# Patient Record
Sex: Female | Born: 1990 | Race: Black or African American | Hispanic: No | Marital: Single | Smoking: Current every day smoker
Health system: Southern US, Community
[De-identification: ages and names within clinical notes are randomized; demographics above are authoritative.]

## PROBLEM LIST (undated history)

## (undated) DIAGNOSIS — J45909 Unspecified asthma, uncomplicated: Secondary | ICD-10-CM

## (undated) HISTORY — PX: NIPPLE SPARING MASTECTOMY/SENTINAL LYMPH NODE BIOPSY/RECONSTRUCTION/PLACEMENT OF TISSUE EXPANDER: SHX6484

## (undated) HISTORY — PX: NO PAST SURGERIES: SHX2092

---

## 2018-12-20 ENCOUNTER — Other Ambulatory Visit: Payer: Self-pay

## 2018-12-20 ENCOUNTER — Encounter (HOSPITAL_COMMUNITY): Payer: Self-pay | Admitting: Obstetrics and Gynecology

## 2018-12-20 ENCOUNTER — Emergency Department (HOSPITAL_COMMUNITY)
Admission: EM | Admit: 2018-12-20 | Discharge: 2018-12-20 | Disposition: A | Discharge status: Home / Self Care | Payer: Self-pay | Attending: Emergency Medicine | Admitting: Emergency Medicine

## 2018-12-20 DIAGNOSIS — R1031 Right lower quadrant pain: Secondary | ICD-10-CM | POA: Insufficient documentation

## 2018-12-20 DIAGNOSIS — Z5321 Procedure and treatment not carried out due to patient leaving prior to being seen by health care provider: Secondary | ICD-10-CM | POA: Insufficient documentation

## 2018-12-20 HISTORY — DX: Unspecified asthma, uncomplicated: J45.909

## 2018-12-20 LAB — CBC
HCT: 36.1 % (ref 36.0–46.0)
Hemoglobin: 10.4 g/dL — ABNORMAL LOW (ref 12.0–15.0)
MCH: 21.9 pg — AB (ref 26.0–34.0)
MCHC: 28.8 g/dL — ABNORMAL LOW (ref 30.0–36.0)
MCV: 76.2 fL — ABNORMAL LOW (ref 80.0–100.0)
Platelets: 388 10*3/uL (ref 150–400)
RBC: 4.74 MIL/uL (ref 3.87–5.11)
RDW: 19.3 % — ABNORMAL HIGH (ref 11.5–15.5)
WBC: 8.3 10*3/uL (ref 4.0–10.5)
nRBC: 0 % (ref 0.0–0.2)

## 2018-12-20 LAB — COMPREHENSIVE METABOLIC PANEL
ALT: 10 U/L (ref 0–44)
ANION GAP: 7 (ref 5–15)
AST: 18 U/L (ref 15–41)
Albumin: 4.1 g/dL (ref 3.5–5.0)
Alkaline Phosphatase: 63 U/L (ref 38–126)
BUN: 11 mg/dL (ref 6–20)
CO2: 25 mmol/L (ref 22–32)
Calcium: 8.8 mg/dL — ABNORMAL LOW (ref 8.9–10.3)
Chloride: 109 mmol/L (ref 98–111)
Creatinine, Ser: 1 mg/dL (ref 0.44–1.00)
GFR calc Af Amer: >60 mL/min (ref >60)
GFR calc non Af Amer: >60 mL/min (ref >60)
Glucose, Bld: 85 mg/dL (ref 70–99)
POTASSIUM: 4.5 mmol/L (ref 3.5–5.1)
Sodium: 141 mmol/L (ref 135–145)
TOTAL PROTEIN: 7.2 g/dL (ref 6.5–8.1)
Total Bilirubin: 0.5 mg/dL (ref 0.3–1.2)

## 2018-12-20 LAB — I-STAT BETA HCG BLOOD, ED (MC, WL, AP ONLY)

## 2018-12-20 LAB — LIPASE, BLOOD: Lipase: 34 U/L (ref 11–51)

## 2018-12-20 NOTE — ED Triage Notes (Signed)
Pt reports pain in the center and RLQ. Pt reports she has had some nausea and emesis but denies diarrhea. Pt reports that her appetite has be pretty normal today.

## 2019-01-11 ENCOUNTER — Emergency Department (HOSPITAL_COMMUNITY): Payer: Self-pay

## 2019-01-11 ENCOUNTER — Emergency Department (HOSPITAL_COMMUNITY)
Admission: EM | Admit: 2019-01-11 | Discharge: 2019-01-11 | Disposition: A | Discharge status: Home / Self Care | Payer: Self-pay | Attending: Emergency Medicine | Admitting: Emergency Medicine

## 2019-01-11 ENCOUNTER — Encounter (HOSPITAL_COMMUNITY): Payer: Self-pay | Admitting: Emergency Medicine

## 2019-01-11 DIAGNOSIS — X58XXXA Exposure to other specified factors, initial encounter: Secondary | ICD-10-CM | POA: Insufficient documentation

## 2019-01-11 DIAGNOSIS — Y999 Unspecified external cause status: Secondary | ICD-10-CM | POA: Insufficient documentation

## 2019-01-11 DIAGNOSIS — S93402A Sprain of unspecified ligament of left ankle, initial encounter: Secondary | ICD-10-CM | POA: Insufficient documentation

## 2019-01-11 DIAGNOSIS — J45909 Unspecified asthma, uncomplicated: Secondary | ICD-10-CM | POA: Insufficient documentation

## 2019-01-11 DIAGNOSIS — Y929 Unspecified place or not applicable: Secondary | ICD-10-CM | POA: Insufficient documentation

## 2019-01-11 DIAGNOSIS — F17210 Nicotine dependence, cigarettes, uncomplicated: Secondary | ICD-10-CM | POA: Insufficient documentation

## 2019-01-11 DIAGNOSIS — Y939 Activity, unspecified: Secondary | ICD-10-CM | POA: Insufficient documentation

## 2019-01-11 MED ORDER — TRAMADOL HCL 50 MG PO TABS
50.0000 mg | ORAL_TABLET | Freq: Once | ORAL | Status: AC
  Administered 2019-01-11: 50 mg via ORAL
  Filled 2019-01-11: qty 1

## 2019-01-11 MED ORDER — NAPROXEN 250 MG PO TABS
500.0000 mg | ORAL_TABLET | Freq: Once | ORAL | Status: AC
  Administered 2019-01-11: 500 mg via ORAL
  Filled 2019-01-11: qty 2

## 2019-01-11 MED ORDER — NAPROXEN 500 MG PO TABS: 500.0000 mg | ORAL_TABLET | Freq: Two times a day (BID) | ORAL | 0 refills | Status: DC

## 2019-01-11 NOTE — ED Triage Notes (Signed)
Pt reports drinking tonight and that she twisted ankle, swelling noted to left ankle.

## 2019-01-11 NOTE — ED Notes (Signed)
PA at bedside.

## 2019-01-11 NOTE — Discharge Instructions (Signed)
Wear a brace at all times for ankle stability, though you may remove this to shower or bathe.  Take naproxen as prescribed to help improve pain and inflammation.  Apply ice to your ankle 3-4 times per day for 15 to 20 minutes each time to limit swelling.  Use crutches from putting weight on your left foot and ankle to help promote healing.  Follow-up with a primary care doctor to ensure that symptoms resolve.

## 2019-01-11 NOTE — ED Provider Notes (Signed)
[facility] EMERGENCY DEPARTMENT Provider Note   CSN: [id number] Arrival date & time: 01/11/19  2111     History   Chief Complaint Chief Complaint  Patient presents with  . Ankle Pain    HPI Lauren French is a 28 y.o. female.  The history is provided by the patient. No language interpreter was used.  Ankle Pain  Location:  Ankle Time since incident:  2 hours Injury: yes   Mechanism of injury comment:  Rolled ankle while walking down concrete steps Ankle location:  L ankle Pain details:    Quality:  Aching   Radiates to:  Does not radiate   Severity:  Moderate   Onset quality:  Sudden   Duration:  2 hours   Timing:  Constant   Progression:  Unchanged Chronicity:  New Dislocation: no   Prior injury to area:  Unable to specify Relieved by:  Nothing Worsened by:  Bearing weight Ineffective treatments:  Acetaminophen Associated symptoms: swelling   Associated symptoms: no muscle weakness, no numbness and no tingling   Risk factors: obesity     Past Medical History:  Diagnosis Date  . Asthma     There are no active problems to display for this patient.   Past Surgical History:  Procedure Laterality Date  . NIPPLE SPARING MASTECTOMY/SENTINAL LYMPH NODE BIOPSY/RECONSTRUCTION/PLACEMENT OF TISSUE EXPANDER       OB History   No obstetric history on file.      Home Medications    Prior to Admission medications   Medication Sig Start Date End Date Taking? Authorizing Provider  naproxen (NAPROSYN) 500 MG tablet Take 1 tablet (500 mg total) by mouth 2 (two) times daily. 01/11/19   Antony Madura, PA-C    Family History History reviewed. No pertinent family history.  Social History Social History   Tobacco Use  . Smoking status: Current Every Day Smoker    Packs/day: 0.20    Types: Cigarettes  . Smokeless tobacco: Never Used  Substance Use Topics  . Alcohol use: Yes    Comment: Social  . Drug use: Not on file     Allergies     Patient has no allergy information on record.   Review of Systems Review of Systems Ten systems reviewed and are negative for acute change, except as noted in the HPI.    Physical Exam Updated Vital Signs BP 124/77 (BP Location: Right Arm)   Pulse 68   Temp (!) 97.4 F (36.3 C) (Oral)   Resp 16   LMP 01/10/2019   SpO2 96%   Physical Exam Vitals signs and nursing note reviewed.  Constitutional:      General: She is not in acute distress.    Appearance: She is well-developed. She is not diaphoretic.     Comments: Nontoxic appearing and in NAD  HENT:     Head: Normocephalic and atraumatic.  Eyes:     General: No scleral icterus.    Conjunctiva/sclera: Conjunctivae normal.  Neck:     Musculoskeletal: Normal range of motion.  Cardiovascular:     Rate and Rhythm: Normal rate and regular rhythm.     Pulses: Normal pulses.     Comments: DP pulse 2+ in the LLE Pulmonary:     Effort: Pulmonary effort is normal. No respiratory distress.     Comments: Respirations even and unlabored Musculoskeletal: Normal range of motion.        General: Swelling (lateral malleolus) and tenderness (lateral malleolus) present. No deformity.  Left ankle: She exhibits swelling. She exhibits no deformity and normal pulse. Tenderness. Lateral malleolus tenderness found. Achilles tendon normal.  Skin:    General: Skin is warm and dry.     Coloration: Skin is not pale.     Findings: No erythema or rash.  Neurological:     Mental Status: She is alert and oriented to person, place, and time.     Coordination: Coordination normal.     Comments: Patient able to wiggle all toes. Sensation to light touch intact.  Psychiatric:        Behavior: Behavior normal.      ED Treatments / Results  Labs (all labs ordered are listed, but only abnormal results are displayed) Labs Reviewed - No data to display  EKG None  Radiology Dg Ankle Complete Left  Result Date: 01/11/2019 CLINICAL DATA:  Left  ankle pain and lateral swelling following a twisting injury falling down steps tonight. EXAM: LEFT ANKLE COMPLETE - 3+ VIEW COMPARISON:  None. FINDINGS: Diffuse soft tissue swelling, most pronounced laterally. Moderate anterior talotibial spur formation. Corticated ossicle adjacent to the distal aspect of the lateral malleolus. Old proximal navicular avulsion fracture dorsally without overlying soft tissue swelling. Mild distal talus spur formation dorsally and small amount of adjacent calcific density with no discrete fracture fragment. No effusion is seen. IMPRESSION: 1. No acute fracture. 2. Old proximal navicular avulsion fracture. 3. Degenerative changes. Electronically Signed   By: Beckie Salts M.D.   On: 01/11/2019 21:41    Procedures Procedures (including critical care time)  Medications Ordered in ED Medications  naproxen (NAPROSYN) tablet 500 mg (has no administration in time range)  traMADol (ULTRAM) tablet 50 mg (has no administration in time range)     Initial Impression / Assessment and Plan / ED Course  I have reviewed the triage vital signs and the nursing notes.  Pertinent labs & imaging results that were available during my care of the patient were reviewed by me and considered in my medical decision making (see chart for details).     Patient presents to the emergency department for evaluation of L ankle pain. Patient neurovascularly intact on exam. Imaging negative for fracture, dislocation, bony deformity. No swelling, erythema, heat to touch to the affected area; no concern for septic joint. Compartments in the affected extremity are soft. Plan for supportive management including RICE and NSAIDs; primary care follow up as needed. Return precautions discussed and provided. Patient discharged in stable condition with no unaddressed concerns.   Final Clinical Impressions(s) / ED Diagnoses   Final diagnoses:  Sprain of left ankle, unspecified ligament, initial encounter     ED Discharge Orders         Ordered    naproxen (NAPROSYN) 500 MG tablet  2 times daily     01/11/19 2208           Antony Madura, PA-C 01/11/19 2217    Maia Plan, MD 01/12/19 1325

## 2019-02-19 ENCOUNTER — Encounter (HOSPITAL_COMMUNITY): Payer: Self-pay | Admitting: Emergency Medicine

## 2019-02-19 ENCOUNTER — Other Ambulatory Visit: Payer: Self-pay

## 2019-02-19 ENCOUNTER — Emergency Department (HOSPITAL_COMMUNITY)
Admission: EM | Admit: 2019-02-19 | Discharge: 2019-02-19 | Disposition: A | Discharge status: Home / Self Care | Payer: Self-pay | Attending: Emergency Medicine | Admitting: Emergency Medicine

## 2019-02-19 DIAGNOSIS — R109 Unspecified abdominal pain: Secondary | ICD-10-CM | POA: Insufficient documentation

## 2019-02-19 DIAGNOSIS — Z5321 Procedure and treatment not carried out due to patient leaving prior to being seen by health care provider: Secondary | ICD-10-CM | POA: Insufficient documentation

## 2019-02-19 MED ORDER — SODIUM CHLORIDE 0.9% FLUSH: 3.0000 mL | Freq: Once | INTRAVENOUS | Status: DC

## 2019-02-19 NOTE — ED Notes (Signed)
No answer for lab draw at this time.

## 2019-02-19 NOTE — ED Triage Notes (Signed)
Patient here from home with complaints of abd pain, n/v, sore throat x 3 days. Sore throat due to vomiting.

## 2019-02-19 NOTE — ED Notes (Signed)
Pt did not answer when called for a room. 

## 2019-02-26 ENCOUNTER — Other Ambulatory Visit: Payer: Self-pay

## 2019-02-26 ENCOUNTER — Encounter (HOSPITAL_COMMUNITY): Payer: Self-pay | Admitting: *Deleted

## 2019-02-26 DIAGNOSIS — J45909 Unspecified asthma, uncomplicated: Secondary | ICD-10-CM | POA: Insufficient documentation

## 2019-02-26 DIAGNOSIS — Z79899 Other long term (current) drug therapy: Secondary | ICD-10-CM | POA: Insufficient documentation

## 2019-02-26 DIAGNOSIS — R5383 Other fatigue: Secondary | ICD-10-CM | POA: Insufficient documentation

## 2019-02-26 DIAGNOSIS — F17210 Nicotine dependence, cigarettes, uncomplicated: Secondary | ICD-10-CM | POA: Insufficient documentation

## 2019-02-26 DIAGNOSIS — D500 Iron deficiency anemia secondary to blood loss (chronic): Secondary | ICD-10-CM | POA: Insufficient documentation

## 2019-02-26 DIAGNOSIS — N939 Abnormal uterine and vaginal bleeding, unspecified: Secondary | ICD-10-CM | POA: Insufficient documentation

## 2019-02-26 NOTE — ED Triage Notes (Signed)
Pt c/o heavy vaginal bleeding x 3 days & feeling really tired.

## 2019-02-27 ENCOUNTER — Emergency Department (HOSPITAL_COMMUNITY): Payer: Self-pay

## 2019-02-27 ENCOUNTER — Emergency Department (HOSPITAL_COMMUNITY)
Admission: EM | Admit: 2019-02-27 | Discharge: 2019-02-27 | Disposition: A | Discharge status: Home / Self Care | Payer: Self-pay | Attending: Emergency Medicine | Admitting: Emergency Medicine

## 2019-02-27 DIAGNOSIS — R5383 Other fatigue: Secondary | ICD-10-CM

## 2019-02-27 DIAGNOSIS — D500 Iron deficiency anemia secondary to blood loss (chronic): Secondary | ICD-10-CM

## 2019-02-27 DIAGNOSIS — N939 Abnormal uterine and vaginal bleeding, unspecified: Secondary | ICD-10-CM

## 2019-02-27 LAB — CBC WITH DIFFERENTIAL/PLATELET
ABS IMMATURE GRANULOCYTES: 0.11 10*3/uL — AB (ref 0.00–0.07)
BASOS PCT: 0 %
Basophils Absolute: 0.1 10*3/uL (ref 0.0–0.1)
Eosinophils Absolute: 0.1 10*3/uL (ref 0.0–0.5)
Eosinophils Relative: 1 %
HCT: 35.1 % — ABNORMAL LOW (ref 36.0–46.0)
Hemoglobin: 9.9 g/dL — ABNORMAL LOW (ref 12.0–15.0)
Immature Granulocytes: 1 %
Lymphocytes Relative: 35 %
Lymphs Abs: 4.1 10*3/uL — ABNORMAL HIGH (ref 0.7–4.0)
MCH: 21.4 pg — ABNORMAL LOW (ref 26.0–34.0)
MCHC: 28.2 g/dL — AB (ref 30.0–36.0)
MCV: 75.8 fL — ABNORMAL LOW (ref 80.0–100.0)
Monocytes Absolute: 0.7 10*3/uL (ref 0.1–1.0)
Monocytes Relative: 6 %
NEUTROS ABS: 6.8 10*3/uL (ref 1.7–7.7)
Neutrophils Relative %: 57 %
Platelets: 447 10*3/uL — ABNORMAL HIGH (ref 150–400)
RBC: 4.63 MIL/uL (ref 3.87–5.11)
RDW: 19.2 % — ABNORMAL HIGH (ref 11.5–15.5)
WBC: 11.8 10*3/uL — ABNORMAL HIGH (ref 4.0–10.5)
nRBC: 0 % (ref 0.0–0.2)

## 2019-02-27 LAB — BASIC METABOLIC PANEL
Anion gap: 7 (ref 5–15)
BUN: 13 mg/dL (ref 6–20)
CO2: 25 mmol/L (ref 22–32)
CREATININE: 1.12 mg/dL — AB (ref 0.44–1.00)
Calcium: 8.9 mg/dL (ref 8.9–10.3)
Chloride: 104 mmol/L (ref 98–111)
GFR calc Af Amer: >60 mL/min (ref >60)
GFR calc non Af Amer: >60 mL/min (ref >60)
Glucose, Bld: 77 mg/dL (ref 70–99)
Potassium: 3.9 mmol/L (ref 3.5–5.1)
SODIUM: 136 mmol/L (ref 135–145)

## 2019-02-27 LAB — WET PREP, GENITAL
Sperm: NONE SEEN
Trich, Wet Prep: NONE SEEN
Yeast Wet Prep HPF POC: NONE SEEN

## 2019-02-27 MED ORDER — OXYCODONE-ACETAMINOPHEN 5-325 MG PO TABS
2.0000 | ORAL_TABLET | Freq: Once | ORAL | Status: AC
  Administered 2019-02-27: 2 via ORAL
  Filled 2019-02-27: qty 2

## 2019-02-27 MED ORDER — FERROUS SULFATE 325 (65 FE) MG PO TABS: 325.0000 mg | ORAL_TABLET | Freq: Every day | ORAL | 0 refills | Status: AC

## 2019-02-27 MED ORDER — ONDANSETRON 8 MG PO TBDP
8.0000 mg | ORAL_TABLET | Freq: Once | ORAL | Status: AC
  Administered 2019-02-27: 8 mg via ORAL
  Filled 2019-02-27: qty 1

## 2019-02-27 NOTE — ED Notes (Signed)
Patient is A & O x4.  She understood discharge instructions.  Questions were answered accordingly.

## 2019-02-27 NOTE — Discharge Instructions (Addendum)
1. Medications: Iron, usual home medications 2. Treatment: rest, drink plenty of fluids,  3. Follow Up: Please followup with your primary doctor or OB/GYN in 5-7 days days for discussion of your diagnoses and further evaluation after today's visit; if you do not have a primary care doctor use the resource guide provided to find one; Please return to the ER for any symptoms, increasing bleeding or other concerns.

## 2019-02-27 NOTE — ED Provider Notes (Signed)
[facility]-EMERGENCY DEPT Provider Note   CSN: [id number] Arrival date & time: 02/26/19  2240    History   Chief Complaint Chief Complaint  Patient presents with   Vaginal Bleeding    HPI Lauren French is a 28 y.o. female with a hx of asthma presents to the Emergency Department complaining of gradual, persistent, progressively worsening vaginal bleeding, lower abdominal pain and fatigue onset 3 days ago.  Patient reports that the symptoms started when her menstrual cycle began.  She reports she is sexually active with one female partner.  She is not attempting to get pregnant and does not believe that she is pregnant.  No vaginal discharge prior to onset of menstrual cycle.  Patient reports that her menstrual cycle is regular and did begin on time this month.  She does report it has been heavier than normal.  She reports needing to change her tampon and pad every hour.  She reports that lower abdominal cramping and nausea are normal during her period but she does not normally feel so fatigued.  She does report a history of iron deficiency anemia.  She has previously taken iron but this was more than 10 years ago.  She does not know what her hemoglobin normally runs.  No specific aggravating or alleviating factors.  No headache, neck pain, chest pain, shortness of breath, vomiting, diarrhea, weakness, dizziness, syncope, dysuria.       The history is provided by the patient and medical records. No language interpreter was used.    Past Medical History:  Diagnosis Date   Asthma     There are no active problems to display for this patient.   Past Surgical History:  Procedure Laterality Date   NIPPLE SPARING MASTECTOMY/SENTINAL LYMPH NODE BIOPSY/RECONSTRUCTION/PLACEMENT OF TISSUE EXPANDER       OB History   No obstetric history on file.      Home Medications    Prior to Admission medications   Medication Sig Start Date End Date Taking? Authorizing  Provider  naproxen sodium (ALEVE) 220 MG tablet Take 440 mg by mouth 2 (two) times daily as needed (pain).   Yes [provider]  ferrous sulfate 325 (65 FE) MG tablet Take 1 tablet (325 mg total) by mouth daily. 02/27/19   Yaritzel Stange, Dahlia Client, PA-C  naproxen (NAPROSYN) 500 MG tablet Take 1 tablet (500 mg total) by mouth 2 (two) times daily. Patient not taking: Reported on 02/27/2019 01/11/19   Antony Madura, PA-C    Family History No family history on file.  Social History Social History   Tobacco Use   Smoking status: Current Every Day Smoker    Packs/day: 0.20    Types: Cigarettes   Smokeless tobacco: Never Used  Substance Use Topics   Alcohol use: Yes    Comment: Social   Drug use: Not Currently     Allergies   Patient has no known allergies.   Review of Systems Review of Systems  Constitutional: Positive for fatigue. Negative for appetite change, diaphoresis, fever and unexpected weight change.  HENT: Negative for mouth sores.   Eyes: Negative for visual disturbance.  Respiratory: Negative for cough, chest tightness, shortness of breath and wheezing.   Cardiovascular: Negative for chest pain.  Gastrointestinal: Positive for abdominal pain. Negative for constipation, diarrhea, nausea and vomiting.  Endocrine: Negative for polydipsia, polyphagia and polyuria.  Genitourinary: Positive for vaginal bleeding. Negative for dysuria, frequency, hematuria and urgency.  Musculoskeletal: Negative for back pain and neck stiffness.  Skin: Negative for rash.  Allergic/Immunologic: Negative for immunocompromised state.  Neurological: Negative for syncope, light-headedness and headaches.  Hematological: Does not bruise/bleed easily.  Psychiatric/Behavioral: Negative for sleep disturbance. The patient is not nervous/anxious.      Physical Exam Updated Vital Signs BP 104/77 (BP Location: Right Arm)    Pulse 60    Temp 97.9 F (36.6 C) (Oral)    Resp 16    Ht 5\' 5"  (1.651  m)    Wt 113.4 kg    LMP 02/23/2019 (Exact Date)    SpO2 100%    BMI 41.60 kg/m   Physical Exam Vitals signs and nursing note reviewed.  Constitutional:      General: She is not in acute distress.    Appearance: She is well-developed. She is not diaphoretic.  HENT:     Head: Normocephalic and atraumatic.     Comments: Pale mucous membranes Eyes:     Conjunctiva/sclera: Conjunctivae normal.     Comments: Pale conjunctive   Neck:     Musculoskeletal: Normal range of motion.  Cardiovascular:     Rate and Rhythm: Normal rate and regular rhythm.     Heart sounds: Normal heart sounds. No murmur.  Pulmonary:     Effort: Pulmonary effort is normal. No respiratory distress.     Breath sounds: Normal breath sounds. No wheezing.  Abdominal:     General: Bowel sounds are normal.     Palpations: Abdomen is soft.     Tenderness: There is no abdominal tenderness. There is no guarding or rebound.     Hernia: There is no hernia in the right inguinal area or left inguinal area.  Genitourinary:    Labia:        Right: No rash, tenderness or lesion.        Left: No rash, tenderness or lesion.      Vagina: No signs of injury and foreign body. Bleeding (scant) present. No vaginal discharge, erythema or tenderness.     Cervix: No cervical motion tenderness, discharge or friability.     Uterus: Not deviated, not enlarged, not fixed and not tender.      Adnexa:        Right: No mass, tenderness or fullness.         Left: Tenderness present. No mass or fullness.    Musculoskeletal: Normal range of motion.  Skin:    General: Skin is warm and dry.     Findings: No erythema.  Neurological:     Mental Status: She is alert.      ED Treatments / Results  Labs (all labs ordered are listed, but only abnormal results are displayed) Labs Reviewed  WET PREP, GENITAL - Abnormal; Notable for the following components:      Result Value   Clue Cells Wet Prep HPF POC PRESENT (*)    WBC, Wet Prep HPF POC  FEW (*)    All other components within normal limits  CBC WITH DIFFERENTIAL/PLATELET - Abnormal; Notable for the following components:   WBC 11.8 (*)    Hemoglobin 9.9 (*)    HCT 35.1 (*)    MCV 75.8 (*)    MCH 21.4 (*)    MCHC 28.2 (*)    RDW 19.2 (*)    Platelets 447 (*)    Lymphs Abs 4.1 (*)    Abs Immature Granulocytes 0.11 (*)    All other components within normal limits  BASIC METABOLIC PANEL - Abnormal; Notable for the  following components:   Creatinine, Ser 1.12 (*)    All other components within normal limits  I-STAT BETA HCG BLOOD, ED ([facility], [facility], [facility] ONLY)  I-STAT BETA HCG BLOOD, ED ([facility], [facility], [facility] ONLY)  GC/CHLAMYDIA PROBE AMP ([facility]) NOT AT [facility]     Radiology US Transvaginal Non-ob  Result Date: 02/27/2019 CLINICAL DATA:  Initial evaluation for acute left lower quadrant abdominal pain, vaginal bleeding. EXAM: TRANSABDOMINAL AND TRANSVAGINAL ULTRASOUND OF PELVIS DOPPLER ULTRASOUND OF OVARIES TECHNIQUE: Both transabdominal and transvaginal ultrasound examinations of the pelvis were performed. Transabdominal technique was performed for global imaging of the pelvis including uterus, ovaries, adnexal regions, and pelvic cul-de-sac. It was necessary to proceed with endovaginal exam following the transabdominal exam to visualize the uterus, endometrium, and ovaries. Color and duplex Doppler ultrasound was utilized to evaluate blood flow to the ovaries. COMPARISON:  None available. FINDINGS: Uterus Measurements: 7.0 x 4.1 x 4.7 cm = volume: 69.9 mL. No fibroids or other mass visualized. Endometrium Thickness: 4 mm.  No focal abnormality visualized. Right ovary Measurements: 2.2 x 1.6 x 2.1 cm = volume: 3.8 mL. Normal appearance/no adnexal mass. Left ovary Measurements: 2.1 x 1.4 x 1.1 cm = volume: 1.7 mL. Normal appearance/no adnexal mass. Pulsed Doppler evaluation of both ovaries demonstrates normal low-resistance arterial and venous waveforms. Other findings No abnormal free fluid.  IMPRESSION: Normal pelvic ultrasound. No evidence for torsion or other acute finding. Electronically Signed   By: Rise Mu M.D.   On: 02/27/2019 04:22   US Pelvis Complete  Result Date: 02/27/2019 CLINICAL DATA:  Initial evaluation for acute left lower quadrant abdominal pain, vaginal bleeding. EXAM: TRANSABDOMINAL AND TRANSVAGINAL ULTRASOUND OF PELVIS DOPPLER ULTRASOUND OF OVARIES TECHNIQUE: Both transabdominal and transvaginal ultrasound examinations of the pelvis were performed. Transabdominal technique was performed for global imaging of the pelvis including uterus, ovaries, adnexal regions, and pelvic cul-de-sac. It was necessary to proceed with endovaginal exam following the transabdominal exam to visualize the uterus, endometrium, and ovaries. Color and duplex Doppler ultrasound was utilized to evaluate blood flow to the ovaries. COMPARISON:  None available. FINDINGS: Uterus Measurements: 7.0 x 4.1 x 4.7 cm = volume: 69.9 mL. No fibroids or other mass visualized. Endometrium Thickness: 4 mm.  No focal abnormality visualized. Right ovary Measurements: 2.2 x 1.6 x 2.1 cm = volume: 3.8 mL. Normal appearance/no adnexal mass. Left ovary Measurements: 2.1 x 1.4 x 1.1 cm = volume: 1.7 mL. Normal appearance/no adnexal mass. Pulsed Doppler evaluation of both ovaries demonstrates normal low-resistance arterial and venous waveforms. Other findings No abnormal free fluid. IMPRESSION: Normal pelvic ultrasound. No evidence for torsion or other acute finding. Electronically Signed   By: Rise Mu M.D.   On: 02/27/2019 04:22   [place] Art/ven Flow Abd Pelv Doppler  Result Date: 02/27/2019 CLINICAL DATA:  Initial evaluation for acute left lower quadrant abdominal pain, vaginal bleeding. EXAM: TRANSABDOMINAL AND TRANSVAGINAL ULTRASOUND OF PELVIS DOPPLER ULTRASOUND OF OVARIES TECHNIQUE: Both transabdominal and transvaginal ultrasound examinations of the pelvis were performed. Transabdominal technique  was performed for global imaging of the pelvis including uterus, ovaries, adnexal regions, and pelvic cul-de-sac. It was necessary to proceed with endovaginal exam following the transabdominal exam to visualize the uterus, endometrium, and ovaries. Color and duplex Doppler ultrasound was utilized to evaluate blood flow to the ovaries. COMPARISON:  None available. FINDINGS: Uterus Measurements: 7.0 x 4.1 x 4.7 cm = volume: 69.9 mL. No fibroids or other mass visualized. Endometrium Thickness: 4 mm.  No focal abnormality visualized. Right  ovary Measurements: 2.2 x 1.6 x 2.1 cm = volume: 3.8 mL. Normal appearance/no adnexal mass. Left ovary Measurements: 2.1 x 1.4 x 1.1 cm = volume: 1.7 mL. Normal appearance/no adnexal mass. Pulsed Doppler evaluation of both ovaries demonstrates normal low-resistance arterial and venous waveforms. Other findings No abnormal free fluid. IMPRESSION: Normal pelvic ultrasound. No evidence for torsion or other acute finding. Electronically Signed   By: Rise Mu M.D.   On: 02/27/2019 04:22    Procedures Procedures (including critical care time)  Medications Ordered in ED Medications  oxyCODONE-acetaminophen (PERCOCET/ROXICET) 5-325 MG per tablet 2 tablet (2 tablets Oral Given 02/27/19 0246)  ondansetron (ZOFRAN-ODT) disintegrating tablet 8 mg (8 mg Oral Given 02/27/19 0246)     Initial Impression / Assessment and Plan / ED Course  I have reviewed the triage vital signs and the nursing notes.  Pertinent labs & imaging results that were available during my care of the patient were reviewed by me and considered in my medical decision making (see chart for details).        Presents for vaginal bleeding.  On pelvic exam scant amount of blood in the vaginal vault.  Labs show mild anemia at 9.9.  Unknown baseline.  She does have a mild leukocytosis and is hemoconcentrated with an elevated platelet count and a slightly elevated serum creatinine.  Suspect dehydration as  patient reports she has not been eating and drinking well for the last few days due to nausea and abdominal pain.  Wet prep with few clue cells and white blood cell present but less likely to be STD or BV at this time.  Patient did have some left adnexal tenderness on exam.  Ultrasound without evidence of ovarian torsion, ovarian cyst or uterine fibroid.  Patient will be given iron supplement and referred to [facility] outpatient clinic for follow-up.  Patient states understanding and is in agreement with the plan.  She is afebrile without tachycardia or hypotension.  Final Clinical Impressions(s) / ED Diagnoses   Final diagnoses:  Vaginal bleeding  Iron deficiency anemia due to chronic blood loss  Fatigue, unspecified type    ED Discharge Orders         Ordered    ferrous sulfate 325 (65 FE) MG tablet  Daily     02/27/19 0458           Tangelia Sanson, Dahlia Client, PA-C 02/27/19 0503    Molpus, Jonny Ruiz, MD 02/27/19 5176

## 2019-03-01 LAB — GC/CHLAMYDIA PROBE AMP (~~LOC~~) NOT AT ARMC
Chlamydia: NEGATIVE
Neisseria Gonorrhea: NEGATIVE

## 2019-03-01 LAB — I-STAT BETA HCG BLOOD, ED (MC, WL, AP ONLY): I-stat hCG, quantitative: <5 m[IU]/mL (ref <5)

## 2019-03-02 ENCOUNTER — Emergency Department (HOSPITAL_COMMUNITY): Payer: Self-pay

## 2019-03-02 ENCOUNTER — Observation Stay (HOSPITAL_COMMUNITY)
Admission: EM | Admit: 2019-03-02 | Discharge: 2019-03-04 | Disposition: A | Discharge status: Other Acute Inpatient Hospital | Payer: Self-pay | Attending: Internal Medicine | Admitting: Internal Medicine

## 2019-03-02 ENCOUNTER — Encounter (HOSPITAL_COMMUNITY): Payer: Self-pay

## 2019-03-02 DIAGNOSIS — T39092A Poisoning by salicylates, intentional self-harm, initial encounter: Secondary | ICD-10-CM

## 2019-03-02 DIAGNOSIS — T39012A Poisoning by aspirin, intentional self-harm, initial encounter: Principal | ICD-10-CM | POA: Insufficient documentation

## 2019-03-02 DIAGNOSIS — Z79899 Other long term (current) drug therapy: Secondary | ICD-10-CM | POA: Insufficient documentation

## 2019-03-02 DIAGNOSIS — T39091A Poisoning by salicylates, accidental (unintentional), initial encounter: Secondary | ICD-10-CM | POA: Diagnosis present

## 2019-03-02 DIAGNOSIS — T39312A Poisoning by propionic acid derivatives, intentional self-harm, initial encounter: Secondary | ICD-10-CM | POA: Insufficient documentation

## 2019-03-02 DIAGNOSIS — D509 Iron deficiency anemia, unspecified: Secondary | ICD-10-CM | POA: Diagnosis present

## 2019-03-02 DIAGNOSIS — F17210 Nicotine dependence, cigarettes, uncomplicated: Secondary | ICD-10-CM | POA: Insufficient documentation

## 2019-03-02 DIAGNOSIS — J45909 Unspecified asthma, uncomplicated: Secondary | ICD-10-CM | POA: Diagnosis present

## 2019-03-02 DIAGNOSIS — T1491XA Suicide attempt, initial encounter: Secondary | ICD-10-CM

## 2019-03-02 DIAGNOSIS — T510X2A Toxic effect of ethanol, intentional self-harm, initial encounter: Secondary | ICD-10-CM | POA: Insufficient documentation

## 2019-03-02 LAB — BASIC METABOLIC PANEL
Anion gap: 10 (ref 5–15)
BUN: 10 mg/dL (ref 6–20)
CO2: 23 mmol/L (ref 22–32)
Calcium: 9.3 mg/dL (ref 8.9–10.3)
Chloride: 106 mmol/L (ref 98–111)
Creatinine, Ser: 0.93 mg/dL (ref 0.44–1.00)
GFR calc Af Amer: >60 mL/min (ref >60)
GFR calc non Af Amer: >60 mL/min (ref >60)
Glucose, Bld: 74 mg/dL (ref 70–99)
Potassium: 3.8 mmol/L (ref 3.5–5.1)
Sodium: 139 mmol/L (ref 135–145)

## 2019-03-02 LAB — CBC WITH DIFFERENTIAL/PLATELET
Abs Immature Granulocytes: 0.05 10*3/uL (ref 0.00–0.07)
BASOS ABS: 0 10*3/uL (ref 0.0–0.1)
Basophils Relative: 0 %
Eosinophils Absolute: 0 10*3/uL (ref 0.0–0.5)
Eosinophils Relative: 0 %
HCT: 32.7 % — ABNORMAL LOW (ref 36.0–46.0)
Hemoglobin: 9.3 g/dL — ABNORMAL LOW (ref 12.0–15.0)
Immature Granulocytes: 1 %
Lymphocytes Relative: 23 %
Lymphs Abs: 2.1 10*3/uL (ref 0.7–4.0)
MCH: 21.1 pg — ABNORMAL LOW (ref 26.0–34.0)
MCHC: 28.4 g/dL — ABNORMAL LOW (ref 30.0–36.0)
MCV: 74.1 fL — ABNORMAL LOW (ref 80.0–100.0)
Monocytes Absolute: 0.5 10*3/uL (ref 0.1–1.0)
Monocytes Relative: 6 %
NRBC: 0 % (ref 0.0–0.2)
Neutro Abs: 6.4 10*3/uL (ref 1.7–7.7)
Neutrophils Relative %: 70 %
Platelets: 385 10*3/uL (ref 150–400)
RBC: 4.41 MIL/uL (ref 3.87–5.11)
RDW: 18.8 % — ABNORMAL HIGH (ref 11.5–15.5)
WBC: 9.1 10*3/uL (ref 4.0–10.5)

## 2019-03-02 LAB — HEPATIC FUNCTION PANEL
ALBUMIN: 4.2 g/dL (ref 3.5–5.0)
ALT: 19 U/L (ref 0–44)
AST: 27 U/L (ref 15–41)
Alkaline Phosphatase: 67 U/L (ref 38–126)
Bilirubin, Direct: <0.1 mg/dL (ref 0.0–0.2)
Total Bilirubin: 0.4 mg/dL (ref 0.3–1.2)
Total Protein: 8 g/dL (ref 6.5–8.1)

## 2019-03-02 LAB — RAPID URINE DRUG SCREEN, HOSP PERFORMED
Amphetamines: NOT DETECTED
Barbiturates: NOT DETECTED
Benzodiazepines: NOT DETECTED
Cocaine: NOT DETECTED
Opiates: NOT DETECTED
Tetrahydrocannabinol: POSITIVE — AB

## 2019-03-02 LAB — ETHANOL: Alcohol, Ethyl (B): 60 mg/dL — ABNORMAL HIGH (ref <10)

## 2019-03-02 LAB — PREGNANCY, URINE: Preg Test, Ur: NEGATIVE

## 2019-03-02 LAB — SALICYLATE LEVEL
Salicylate Lvl: 21.1 mg/dL (ref 2.8–30.0)
Salicylate Lvl: 30 mg/dL (ref 2.8–30.0)

## 2019-03-02 LAB — ACETAMINOPHEN LEVEL

## 2019-03-02 MED ORDER — ONDANSETRON HCL 4 MG/2ML IJ SOLN
4.0000 mg | Freq: Once | INTRAMUSCULAR | Status: AC
  Administered 2019-03-02: 4 mg via INTRAVENOUS
  Filled 2019-03-02: qty 2

## 2019-03-02 MED ORDER — LACTATED RINGERS IV SOLN
INTRAVENOUS | Status: DC
  Administered 2019-03-02: 20:00:00 via INTRAVENOUS

## 2019-03-02 NOTE — ED Notes (Signed)
Pt has been changed into scrubs.  Pt has 1 bag of belongings at desk.

## 2019-03-02 NOTE — ED Provider Notes (Signed)
28 year old female received at signout from Lauren French pending repeat labs and re-evaluation and psychiatry consult once the patient is medically cleared. Per her HPI:   "Lauren French is a 28 y.o. female who presents with an overdose and suicide attempt. PMH significant for anemia. The patient states that today she just got tired of living and decided to kill herself. She took two handfuls of generic Aspirin and Advil about 30-45 min PTA (~5-6PM). Her mother called EMS. She also endorses alcohol use today. She is originally from [place], [place]. She moved down here to be with her girlfriend. She got in an argument with her girlfriend today but states that overall she is just tired of life and tired of being hurt by other people. He called her Mother and told her she what happened and EMS was called to the home. She told EMS she took about 50 Aspirin and Advil but states that she really isn't sure how much she took and just gave them a number. She has had suicide attempts in the past. She has been living in [place] for about 1 month. She reports overall fatigue, nausea, generalized abdominal pain. No fever, headache, tinnitus, SOB, vomiting. She is asking to be discharged so she can go to work tomorrow. Pt was IVC'd by her mother."  Physical Exam  BP (!) 122/58 (BP Location: Left Arm)   Pulse 74   Temp 98.9 F (37.2 C) (Oral)   Resp 20   Ht 5\' 5"  (1.651 m)   Wt 117.9 kg   LMP 02/24/2019   SpO2 100%   BMI 43.27 kg/m   Physical Exam Vitals signs and nursing note reviewed.  Constitutional:      Appearance: She is well-developed.  HENT:     Head: Normocephalic and atraumatic.  Eyes:     Extraocular Movements: Extraocular movements intact.     Pupils: Pupils are equal, round, and reactive to light.  Neck:     Musculoskeletal: Normal range of motion.  Cardiovascular:     Rate and Rhythm: Normal rate. Rhythm irregular.     Pulses: Normal pulses.     Heart sounds: Normal heart sounds.   Pulmonary:     Effort: Pulmonary effort is normal.     Breath sounds: Normal breath sounds.  Musculoskeletal: Normal range of motion.  Neurological:     Mental Status: She is alert and oriented to person, place, and time.     Comments: Resting comfortably.  Rouses easily to voice.  Alert and oriented x3.  Answers questions appropriately. GCS 15.       ED Course/Procedures     .Critical Care Performed by: Barkley Boards, PA-C Authorized by: Barkley Boards, PA-C   Critical care provider statement:    Critical care time (minutes):  50   Critical care time was exclusive of:  Separately billable procedures and treating other patients and teaching time   Critical care was necessary to treat or prevent imminent or life-threatening deterioration of the following conditions:  Toxidrome   Critical care was time spent personally by me on the following activities:  Ordering and performing treatments and interventions, ordering and review of laboratory studies, re-evaluation of patient's condition, review of old charts, obtaining history from patient or surrogate, examination of patient, evaluation of patient's response to treatment, development of treatment plan with patient or surrogate, discussions with consultants and pulse oximetry   I assumed direction of critical care for this patient from another provider in my specialty: yes  MDM   28 year old female with a history of asthma received at signout from [facility] after the patient took approximately fifty 81mg  ASA and Advil at 1700 followed by three 32-oz beers and 3 shots of vodka.  The patient has had uptrending salicylate levels 30-> 39.1.  Spoke with Verlon Au at poison control who recommended treatment with sodium bicarbonate, potassium chloride, and D5W based on poison control algorithm, which she has faxed to me.  She recommended repeat salicylate levels, electrolytes, calcium, glucose, and urine pH every 3 hours until the patient  improves clinically and levels can be checked every 6 hours.  Initial potassium was 3.8 and based on algorithm should be replenished if her level is less than 4.2.  She was given 20 mEq of IV potassium chloride and 20 mEq of oral potassium chloride.  Magnesium is normal at 2.2.  Serum glucose is 74.  Patient's pH on VBG was 7.425 and she was given a 100 mEq bolus of sodium chloride based on her weight of 117.9 kg.  She also received 150 mEq infusion of sodium bicarbonate in D5W.  On repeat creatinine, there is a minimal increase of 0.1.  Labs are otherwise notable for hemoglobin of 9.3, which appears chronic.  UDS is positive for THC.  Ethanol level is 60.   Third salicylate level is 41.7.  Time to peak serum level on immediately release non-enteric coated ASA is 1-2 hours, 3-4 hours on enteric coated, and 2 hours on extended release capsules. Rise of salicylate curve appears to be flattening.  Mentation continues to be intact.  She did have a repeat episode of vomiting and was given Reglan.  She continues to be hemodynamically stable.  Low suspicion that she will need emergent hemodialysis based on repeat salicylate levels and minimal change in creatinine levels.  The patient was discussed with Dr. Bebe Shaggy, attending physician.  TTS has not been consulted as the patient is not yet medically cleared.  She is IVCD at this time.  She will require psychiatry's consult once she is cleared.  Consult to the hospitalist team and spoke with Dr. Antionette Char who will accept the patient for admission.   Frederik Pear A, PA-C 03/03/19 0354    Zadie Rhine, MD 03/03/19 [phone]

## 2019-03-02 NOTE — ED Notes (Signed)
Bed: UD14 Expected date:  Expected time:  Means of arrival:  Comments: 53F OD 50 81mg  aspirin

## 2019-03-02 NOTE — ED Notes (Signed)
Pt transported to xray 

## 2019-03-02 NOTE — ED Provider Notes (Signed)
[facility]-EMERGENCY DEPT Provider Note   CSN: [id number] Arrival date & time: 03/02/19  1813    History   Chief Complaint Chief Complaint  Patient presents with  . Drug Overdose    HPI Lauren French is a 28 y.o. female who presents with an overdose and suicide attempt. PMH significant for anemia. The patient states that today she just got tired of living and decided to kill herself. She took two handfuls of generic Aspirin and Advil about 30-45 min PTA (~5-6PM). Her mother called EMS. She also endorses alcohol use today. She is originally from [place], [place]. She moved down here to be with her girlfriend. She got in an argument with her girlfriend today but states that overall she is just tired of life and tired of being hurt by other people. He called her Mother and told her she what happened and EMS was called to the home. She told EMS she took about 50 Aspirin and Advil but states that she really isn't sure how much she took and just gave them a number. She has had suicide attempts in the past. She has been living in [place] for about 1 month. She reports overall fatigue, nausea, generalized abdominal pain. No fever, headache, tinnitus, SOB, vomiting. She is asking to be discharged so she can go to work tomorrow. Pt was IVC'd by her mother.    HPI  Past Medical History:  Diagnosis Date  . Asthma     There are no active problems to display for this patient.   Past Surgical History:  Procedure Laterality Date  . NIPPLE SPARING MASTECTOMY/SENTINAL LYMPH NODE BIOPSY/RECONSTRUCTION/PLACEMENT OF TISSUE EXPANDER       OB History   No obstetric history on file.      Home Medications    Prior to Admission medications   Medication Sig Start Date End Date Taking? Authorizing Provider  ferrous sulfate 325 (65 FE) MG tablet Take 1 tablet (325 mg total) by mouth daily. 02/27/19   Muthersbaugh, Dahlia Client, PA-C  naproxen (NAPROSYN) 500 MG tablet Take 1  tablet (500 mg total) by mouth 2 (two) times daily. Patient not taking: Reported on 02/27/2019 01/11/19   Antony Madura, PA-C  naproxen sodium (ALEVE) 220 MG tablet Take 440 mg by mouth 2 (two) times daily as needed (pain).    [provider]    Family History No family history on file.  Social History Social History   Tobacco Use  . Smoking status: Current Every Day Smoker    Packs/day: 0.20    Types: Cigarettes  . Smokeless tobacco: Never Used  Substance Use Topics  . Alcohol use: Yes    Comment: Social  . Drug use: Not Currently     Allergies   Patient has no known allergies.   Review of Systems Review of Systems  Constitutional: Negative for fever.  HENT:       -tinnitus  Respiratory: Negative for shortness of breath.   Cardiovascular: Negative for chest pain.  Gastrointestinal: Positive for abdominal pain and nausea. Negative for diarrhea and vomiting.  Neurological: Negative for headaches.  Psychiatric/Behavioral: Positive for dysphoric mood, self-injury and suicidal ideas.  All other systems reviewed and are negative.    Physical Exam Updated Vital Signs BP 127/82   Pulse 77   Temp 98.9 F (37.2 C) (Oral)   Resp 16   Ht 5\' 5"  (1.651 m)   Wt 117.9 kg   LMP 02/23/2019 (Exact Date)   SpO2 100%  BMI 43.27 kg/m   Physical Exam Vitals signs and nursing note reviewed.  Constitutional:      General: She is not in acute distress.    Appearance: She is well-developed. She is obese. She is not ill-appearing.     Comments: Cooperative. Clinically intoxicated   HENT:     Head: Normocephalic and atraumatic.  Eyes:     General: No scleral icterus.       Right eye: No discharge.        Left eye: No discharge.     Conjunctiva/sclera: Conjunctivae normal.     Pupils: Pupils are equal, round, and reactive to light.  Neck:     Musculoskeletal: Normal range of motion.  Cardiovascular:     Rate and Rhythm: Normal rate and regular rhythm.  Pulmonary:      Effort: Pulmonary effort is normal. No respiratory distress.     Breath sounds: Normal breath sounds.  Abdominal:     General: There is no distension.     Palpations: Abdomen is soft.     Tenderness: There is abdominal tenderness (mild, generalized).  Skin:    General: Skin is warm and dry.  Neurological:     Mental Status: She is alert and oriented to person, place, and time.  Psychiatric:        Attention and Perception: Attention normal.        Mood and Affect: Mood is depressed. Affect is tearful.        Speech: Speech is slurred.        Behavior: Behavior normal. Behavior is cooperative.        Thought Content: Thought content is not paranoid or delusional. Thought content includes suicidal ideation. Thought content does not include homicidal ideation. Thought content includes suicidal plan. Thought content does not include homicidal plan.        Cognition and Memory: Cognition normal.      ED Treatments / Results  Labs (all labs ordered are listed, but only abnormal results are displayed) Labs Reviewed  CBC WITH DIFFERENTIAL/PLATELET - Abnormal; Notable for the following components:      Result Value   Hemoglobin 9.3 (*)    HCT 32.7 (*)    MCV 74.1 (*)    MCH 21.1 (*)    MCHC 28.4 (*)    RDW 18.8 (*)    All other components within normal limits  ETHANOL - Abnormal; Notable for the following components:   Alcohol, Ethyl (B) 60 (*)    All other components within normal limits  RAPID URINE DRUG SCREEN, HOSP PERFORMED - Abnormal; Notable for the following components:   Tetrahydrocannabinol POSITIVE (*)    All other components within normal limits  ACETAMINOPHEN LEVEL - Abnormal; Notable for the following components:   Acetaminophen (Tylenol), Serum <10 (*)    All other components within normal limits  BASIC METABOLIC PANEL  PREGNANCY, URINE  SALICYLATE LEVEL  HEPATIC FUNCTION PANEL  SALICYLATE LEVEL  SALICYLATE LEVEL    EKG EKG Interpretation  Date/Time:   Tuesday March 02 2019 19:46:01 EDT Ventricular Rate:  71 PR Interval:    QRS Duration: 82 QT Interval:  392 QTC Calculation: 426 R Axis:   39 Text Interpretation:  Sinus rhythm No previous ECGs available Confirmed by Zadie Rhine ([id number]) on 03/02/2019 11:06:46 PM   Radiology Dg Chest 2 View  Result Date: 03/02/2019 CLINICAL DATA:  Overdose. EXAM: CHEST - 2 VIEW COMPARISON:  None. FINDINGS: The heart size and mediastinal contours are within  normal limits. Both lungs are clear. The visualized skeletal structures are unremarkable. IMPRESSION: No active cardiopulmonary disease. Electronically Signed   By: Lupita Raider, M.D.   On: 03/02/2019 19:40    Procedures Procedures (including critical care time)  Medications Ordered in ED Medications  lactated ringers infusion ( Intravenous New Bag/Given 03/02/19 2011)  ondansetron (ZOFRAN) injection 4 mg (4 mg Intravenous Given 03/02/19 2009)     Initial Impression / Assessment and Plan / ED Course  I have reviewed the triage vital signs and the nursing notes.  Pertinent labs & imaging results that were available during my care of the patient were reviewed by me and considered in my medical decision making (see chart for details).  28 year old female presents with suicide attempt and drug overdose. She reports taking unknown amount of Aspirin and Ibuprofen. Her vitals are normal here. She initially just reports fatigue and nausea. She was IVD'd by her mother. Will obtain medical clearance labs as well as Salicylate levels.   Pt had episode of vomiting. Zofran and LR infusion started.  CBC is remarkable for anemia which around baseline. BMP is normal. ASA level is 21. UDS is positive for THC and ETOH is 60. Tylenol level is normal. EKG is SR. Discussed with poison control. They recommend monitoring ASA levels every 2-3 hours until she has 2 down trending results. If level gets to 40, then bicarb is given.  2nd level is 30. Discussed with  poison control again. They recommend at least 2 more levels.   Care was signed out to [facility] PA-C at shift change pending Aspirin results. Of note, pt has been intermittently agitated but redirectable by nursing and security. She has continued to be hemodynamically stable but just wants to leave.    Final Clinical Impressions(s) / ED Diagnoses   Final diagnoses:  Salicylate overdose, intentional self-harm, initial encounter [facility])  Suicide attempt [facility])    ED Discharge Orders    None       Bethel Born, PA-C 03/02/19 2319    Maia Plan, MD 03/02/19 2325

## 2019-03-02 NOTE — Progress Notes (Signed)
Pt refused abg.  RN/PA notified of pt refusal.

## 2019-03-02 NOTE — ED Notes (Signed)
Patient removed EKG leads and attempted to take out IV, "stating I just want to go home. I just want to leave". Explained to patient that she could not leave at this time and could not have her personal belongings. Patient tearful, but support provided by staff. Provided patient with warm blanket and dimmed lights. Patient still visible at this time; will continue to monitor closely.

## 2019-03-02 NOTE — ED Notes (Signed)
Pt went to bathroom furthest from her room after being instructed to go to the bathroom closest to room. Staff accompanied pt to bathroom, however, when leaving she tried to walk towards the exit. Pt appears to aggravated, stating " I just want my stuff and I want to leave." Pt was escorted back to room. Security and off-duty [employer] made aware. Will continue to monitor pt.

## 2019-03-02 NOTE — ED Notes (Signed)
Respiratory paged for ABG collection.

## 2019-03-02 NOTE — ED Triage Notes (Signed)
Pt BIB [facility] from home. Pt took 50 81mg  ASA @1700 , followed by 3-32oz beers and 3 shots of vodka. Pt express she was wanting to harm herself, however she does want help. Pt is A&O x4.

## 2019-03-02 NOTE — ED Notes (Addendum)
Patient vomited on floor x1; vomitus pink and chunky. Patient complaining of nausea currently and dry heaving. EDP made aware.

## 2019-03-03 ENCOUNTER — Encounter (HOSPITAL_COMMUNITY): Payer: Self-pay | Admitting: Family Medicine

## 2019-03-03 ENCOUNTER — Other Ambulatory Visit: Payer: Self-pay

## 2019-03-03 DIAGNOSIS — T39091A Poisoning by salicylates, accidental (unintentional), initial encounter: Secondary | ICD-10-CM | POA: Diagnosis present

## 2019-03-03 DIAGNOSIS — T39092A Poisoning by salicylates, intentional self-harm, initial encounter: Secondary | ICD-10-CM

## 2019-03-03 DIAGNOSIS — D509 Iron deficiency anemia, unspecified: Secondary | ICD-10-CM

## 2019-03-03 DIAGNOSIS — J45909 Unspecified asthma, uncomplicated: Secondary | ICD-10-CM | POA: Diagnosis present

## 2019-03-03 LAB — COMPREHENSIVE METABOLIC PANEL
ALT: 20 U/L (ref 0–44)
AST: 27 U/L (ref 15–41)
Albumin: 4 g/dL (ref 3.5–5.0)
Alkaline Phosphatase: 66 U/L (ref 38–126)
Anion gap: 12 (ref 5–15)
BUN: 10 mg/dL (ref 6–20)
CO2: 24 mmol/L (ref 22–32)
Calcium: 8.8 mg/dL — ABNORMAL LOW (ref 8.9–10.3)
Chloride: 104 mmol/L (ref 98–111)
Creatinine, Ser: 1.1 mg/dL — ABNORMAL HIGH (ref 0.44–1.00)
Glucose, Bld: 83 mg/dL (ref 70–99)
Potassium: 3.3 mmol/L — ABNORMAL LOW (ref 3.5–5.1)
Sodium: 140 mmol/L (ref 135–145)
Total Bilirubin: 0.2 mg/dL — ABNORMAL LOW (ref 0.3–1.2)
Total Protein: 7.7 g/dL (ref 6.5–8.1)

## 2019-03-03 LAB — CBC WITH DIFFERENTIAL/PLATELET
Abs Immature Granulocytes: 0.02 10*3/uL (ref 0.00–0.07)
Basophils Absolute: 0 10*3/uL (ref 0.0–0.1)
Basophils Relative: 1 %
Eosinophils Absolute: 0 10*3/uL (ref 0.0–0.5)
Eosinophils Relative: 0 %
HCT: 34.2 % — ABNORMAL LOW (ref 36.0–46.0)
Hemoglobin: 9.9 g/dL — ABNORMAL LOW (ref 12.0–15.0)
Immature Granulocytes: 0 %
Lymphocytes Relative: 22 %
Lymphs Abs: 1.7 10*3/uL (ref 0.7–4.0)
MCH: 21.2 pg — ABNORMAL LOW (ref 26.0–34.0)
MCHC: 28.9 g/dL — ABNORMAL LOW (ref 30.0–36.0)
MCV: 73.1 fL — ABNORMAL LOW (ref 80.0–100.0)
Monocytes Absolute: 0.5 10*3/uL (ref 0.1–1.0)
Monocytes Relative: 7 %
NRBC: 0 % (ref 0.0–0.2)
Neutro Abs: 5.4 10*3/uL (ref 1.7–7.7)
Neutrophils Relative %: 70 %
Platelets: 418 10*3/uL — ABNORMAL HIGH (ref 150–400)
RBC: 4.68 MIL/uL (ref 3.87–5.11)
RDW: 19.1 % — ABNORMAL HIGH (ref 11.5–15.5)
WBC: 7.7 10*3/uL (ref 4.0–10.5)

## 2019-03-03 LAB — BLOOD GAS, VENOUS
Acid-Base Excess: 1.5 mmol/L (ref 0.0–2.0)
BICARBONATE: 25.4 mmol/L (ref 20.0–28.0)
O2 Saturation: 46.1 %
Patient temperature: 98.6
pCO2, Ven: 39.4 mmHg — ABNORMAL LOW (ref 44.0–60.0)
pH, Ven: 7.425 (ref 7.250–7.430)

## 2019-03-03 LAB — BASIC METABOLIC PANEL
Anion gap: 9 (ref 5–15)
BUN: 11 mg/dL (ref 6–20)
CO2: 23 mmol/L (ref 22–32)
Calcium: 9.3 mg/dL (ref 8.9–10.3)
Chloride: 106 mmol/L (ref 98–111)
Creatinine, Ser: 1.04 mg/dL — ABNORMAL HIGH (ref 0.44–1.00)
GFR calc Af Amer: >60 mL/min (ref >60)
GLUCOSE: 80 mg/dL (ref 70–99)
Potassium: 3.8 mmol/L (ref 3.5–5.1)
Sodium: 138 mmol/L (ref 135–145)

## 2019-03-03 LAB — SALICYLATE LEVEL
Salicylate Lvl: 22.3 mg/dL (ref 2.8–30.0)
Salicylate Lvl: 31.7 mg/dL (ref 2.8–30.0)
Salicylate Lvl: 39.1 mg/dL (ref 2.8–30.0)
Salicylate Lvl: 41.7 mg/dL (ref 2.8–30.0)

## 2019-03-03 LAB — PROTIME-INR
INR: 1 (ref 0.8–1.2)
Prothrombin Time: 13.3 seconds (ref 11.4–15.2)

## 2019-03-03 LAB — MAGNESIUM: Magnesium: 2.2 mg/dL (ref 1.7–2.4)

## 2019-03-03 MED ORDER — POTASSIUM CHLORIDE 10 MEQ/100ML IV SOLN
10.0000 meq | INTRAVENOUS | Status: AC
  Administered 2019-03-03 (×2): 10 meq via INTRAVENOUS
  Filled 2019-03-03 (×4): qty 100

## 2019-03-03 MED ORDER — METOCLOPRAMIDE HCL 5 MG/ML IJ SOLN
10.0000 mg | Freq: Once | INTRAMUSCULAR | Status: AC
  Administered 2019-03-03: 10 mg via INTRAVENOUS
  Filled 2019-03-03: qty 2

## 2019-03-03 MED ORDER — ONDANSETRON HCL 4 MG PO TABS: 4.0000 mg | ORAL_TABLET | Freq: Four times a day (QID) | ORAL | Status: DC | PRN

## 2019-03-03 MED ORDER — ACETAMINOPHEN 650 MG RE SUPP: 650.0000 mg | Freq: Four times a day (QID) | RECTAL | Status: DC | PRN

## 2019-03-03 MED ORDER — SODIUM CHLORIDE 0.9% FLUSH
3.0000 mL | Freq: Two times a day (BID) | INTRAVENOUS | Status: DC
  Administered 2019-03-03 (×2): 3 mL via INTRAVENOUS

## 2019-03-03 MED ORDER — ACETAMINOPHEN 325 MG PO TABS: 650.0000 mg | ORAL_TABLET | Freq: Four times a day (QID) | ORAL | Status: DC | PRN

## 2019-03-03 MED ORDER — SODIUM BICARBONATE 8.4 % IV SOLN
INTRAVENOUS | Status: DC
  Administered 2019-03-03 (×2): via INTRAVENOUS
  Filled 2019-03-03 (×2): qty 150

## 2019-03-03 MED ORDER — SODIUM BICARBONATE 8.4 % IV SOLN
100.0000 meq | Freq: Once | INTRAVENOUS | Status: AC
  Administered 2019-03-03: 100 meq via INTRAVENOUS
  Filled 2019-03-03: qty 100

## 2019-03-03 MED ORDER — POTASSIUM CHLORIDE CRYS ER 20 MEQ PO TBCR
20.0000 meq | EXTENDED_RELEASE_TABLET | Freq: Once | ORAL | Status: AC
  Administered 2019-03-03: 20 meq via ORAL
  Filled 2019-03-03: qty 1

## 2019-03-03 MED ORDER — ONDANSETRON HCL 4 MG/2ML IJ SOLN: 4.0000 mg | Freq: Four times a day (QID) | INTRAMUSCULAR | Status: DC | PRN

## 2019-03-03 NOTE — ED Notes (Signed)
Date and time results received: 03/03/19 12:17 AM  Test: Salicylate  Critical Value: 39.1  Name of Provider Notified: [place]   Orders Received? Or Actions Taken?:  Will continue to monitor patient.

## 2019-03-03 NOTE — Progress Notes (Signed)
Received call from Poison control about patient. Stated over the phone that the patient qualifies to come off of bicarb drip and can be switched to LR or NACL for hydration.

## 2019-03-03 NOTE — ED Notes (Signed)
Date and time results received: 03/03/19 3:24   Test: Salicylate  Critical Value: 41.7mg /dL  Name of Provider Notified: Frederik Pear, PA   Orders Received? Or Actions Taken?: Will continue to monitor and await for new orders.

## 2019-03-03 NOTE — ED Notes (Signed)
Pts girlfriend - Elliot Gurney [phone] - came and picked up pt belongings. Pt approved of girlfriend picking up and being in possession of her belongings. Belongings verified with pt and another staff member - Ladona Ridgel, Charity fundraiser.  Belongings Include: 1 pair of Black Nike Slides [employer] Shirt Black Hoodie Black Pants Multicolored Bractlet 1 [place]ime Green Lighter 1 Pack of [place] 2 Phones ([employer] in [place] with green stripe)

## 2019-03-03 NOTE — Plan of Care (Signed)
Care plan initiated.

## 2019-03-03 NOTE — Progress Notes (Signed)
Spoke with poison control and informed of patient being off fluids and eating and drinking fine.

## 2019-03-03 NOTE — H&P (Addendum)
History and Physical    Lauren StanleyShaquisia Commisso ZOX:[id number] DOB: 10/09/1991 DOA: 03/02/2019  PCP: Patient, No Pcp Per   Patient coming from: Home   Chief Complaint: Intentional overdose   HPI: Lauren French is a 28 y.o. female with medical history significant for asthma, now presenting to the emergency department after reportedly ingesting excessive amounts of aspirin,  Advil, and alcohol.  Patient reported recently moving to this area to be with her significant other, was arguing with the significant other today, expressed feeling "tired of life" and "tired of being hurt," and took a roughly 50 aspirin 81 mg and Advil tablets, had three 32-oz beers, and 3 shots of vodka at ~5 pm.  Patient's mother called EMS and patient was transported to the hospital.  She denies tinnitus, shortness of breath, confusion, chest pain, or palpitations.  She reports some nausea without abdominal pain.  ED Course: Upon arrival to the ED, patient is found to be afebrile, saturating well on room air, and with vitals otherwise normal.  EKG features a sinus rhythm and chest x-ray is negative for acute cardiopulmonary disease.  Chemistry panel is unremarkable and CBC notable for microcytic anemia with hemoglobin of 9.3.  Alcohol level is 60, acetaminophen undetectable, and initial salicylate level 21.1.  Salicylate levels subsequently increased to 30 and then 39.1.  IVC was taken out by the patient's mother.  Patient was treated with sodium bicarbonate per poison control recommendations, remains hemodynamically stable, in no apparent respiratory distress, alert, and oriented, and she will be observed for ongoing evaluation and management.  Review of Systems:  All other systems reviewed and apart from HPI, are negative.  Past Medical History:  Diagnosis Date  . Asthma     Past Surgical History:  Procedure Laterality Date  . NIPPLE SPARING MASTECTOMY/SENTINAL LYMPH NODE BIOPSY/RECONSTRUCTION/PLACEMENT OF TISSUE  EXPANDER       reports that she has been smoking cigarettes. She has been smoking about 0.20 packs per day. She has never used smokeless tobacco. She reports current alcohol use. She reports current drug use. Drug: Marijuana.  No Known Allergies  History reviewed. No pertinent family history.   Prior to Admission medications   Medication Sig Start Date End Date Taking? Authorizing Provider  ferrous sulfate 325 (65 FE) MG tablet Take 1 tablet (325 mg total) by mouth daily. Patient taking differently: Take 162.5 mg by mouth daily.  02/27/19  Yes Muthersbaugh, Dahlia ClientHannah, PA-C  naproxen (NAPROSYN) 500 MG tablet Take 1 tablet (500 mg total) by mouth 2 (two) times daily. Patient not taking: Reported on 02/27/2019 01/11/19   Antony MaduraHumes, Kelly, PA-C    Physical Exam: Vitals:   03/03/19 0010 03/03/19 0113 03/03/19 0130 03/03/19 0300  BP: 107/61 118/72 128/74 (!) 110/58  Pulse: 60 70 69 62  Resp: 16 18 17 20   Temp:      TempSrc:      SpO2: 100% 99% 100% 100%  Weight:      Height:        Constitutional: NAD, calm  Eyes: PERTLA, lids and conjunctivae normal ENMT: Mucous membranes are moist. Posterior pharynx clear of any exudate or lesions.   Neck: normal, supple, no masses, no thyromegaly Respiratory: clear to auscultation bilaterally, no wheezing, no crackles. Normal respiratory effort.   Cardiovascular: S1 & S2 heard, regular rate and rhythm. No extremity edema.   Abdomen: No distension, no tenderness, soft. Bowel sounds active.  Musculoskeletal: no clubbing / cyanosis. No joint deformity upper and lower extremities.    Skin:  no significant rashes, lesions, ulcers. Warm, dry, well-perfused. Neurologic: No facial asymmetry. Sensation intact. Moving all extremities.  Psychiatric: Sleeping, easily woken. Alert and oriented to person, place, and situation. Pleasant, cooperative.    Labs on Admission: I have personally reviewed following labs and imaging studies  CBC: Recent Labs  Lab 02/26/19  2340 03/02/19 1845  WBC 11.8* 9.1  NEUTROABS 6.8 6.4  HGB 9.9* 9.3*  HCT 35.1* 32.7*  MCV 75.8* 74.1*  PLT 447* 385   Basic Metabolic Panel: Recent Labs  Lab 02/26/19 2340 03/02/19 1845 03/03/19 0100  NA 136 139 138  K 3.9 3.8 3.8  CL 104 106 106  CO2 25 23 23   GLUCOSE 77 74 80  BUN 13 10 11   CREATININE 1.12* 0.93 1.04*  CALCIUM 8.9 9.3 9.3  MG  --   --  2.2   GFR: Estimated Creatinine Clearance: 104.4 mL/min (A) (by C-G formula based on SCr of 1.04 mg/dL (H)). Liver Function Tests: Recent Labs  Lab 03/02/19 1849  AST 27  ALT 19  ALKPHOS 67  BILITOT 0.4  PROT 8.0  ALBUMIN 4.2   No results for input(s): LIPASE, AMYLASE in the last 168 hours. No results for input(s): AMMONIA in the last 168 hours. Coagulation Profile: No results for input(s): INR, PROTIME in the last 168 hours. Cardiac Enzymes: No results for input(s): CKTOTAL, CKMB, CKMBINDEX, TROPONINI in the last 168 hours. BNP (last 3 results) No results for input(s): PROBNP in the last 8760 hours. HbA1C: No results for input(s): HGBA1C in the last 72 hours. CBG: No results for input(s): GLUCAP in the last 168 hours. Lipid Profile: No results for input(s): CHOL, HDL, LDLCALC, TRIG, CHOLHDL, LDLDIRECT in the last 72 hours. Thyroid Function Tests: No results for input(s): TSH, T4TOTAL, FREET4, T3FREE, THYROIDAB in the last 72 hours. Anemia Panel: No results for input(s): VITAMINB12, FOLATE, FERRITIN, TIBC, IRON, RETICCTPCT in the last 72 hours. Urine analysis: No results found for: COLORURINE, APPEARANCEUR, LABSPEC, PHURINE, GLUCOSEU, HGBUR, BILIRUBINUR, KETONESUR, PROTEINUR, UROBILINOGEN, NITRITE, LEUKOCYTESUR Sepsis Labs: @LABRCNTIP (procalcitonin:4,lacticidven:4) ) Recent Results (from the past 240 hour(s))  Wet prep, genital     Status: Abnormal   Collection Time: 02/27/19  2:39 AM  Result Value Ref Range Status   Yeast Wet Prep HPF POC NONE SEEN NONE SEEN Final   Trich, Wet Prep NONE SEEN NONE  SEEN Final   Clue Cells Wet Prep HPF POC PRESENT (A) NONE SEEN Final   WBC, Wet Prep HPF POC FEW (A) NONE SEEN Final   Sperm NONE SEEN  Final    Comment: Performed at [facility], [address] [place], [place] [address]     Radiological Exams on Admission: Dg Chest 2 View  Result Date: 03/02/2019 CLINICAL DATA:  Overdose. EXAM: CHEST - 2 VIEW COMPARISON:  None. FINDINGS: The heart size and mediastinal contours are within normal limits. Both lungs are clear. The visualized skeletal structures are unremarkable. IMPRESSION: No active cardiopulmonary disease. Electronically Signed   By: Lupita Raider, M.D.   On: 03/02/2019 19:40    EKG: Independently reviewed. Sinus rhythm.   Assessment/Plan   1. Salicylate overdose, intentional  - Presents following intentional overdose with ASA and Advil at ~5 pm on 03/02/19 - She acknowledges trying to harm herself, but seems to regret it and now wants help  - She was experiencing nausea, but otherwise asymptomatic  - Initial salicylate level 21, subsequently increasing to 39  - Poison control was consulted and patient was  started on IVF hydration and bicarbonate infusion  - Continue bicarbonate, IVF for goal UOP of 2cc/kg/hr, repeat salicylate levels q3h for now, continue neuro checks, and consult with nephrology if salicylate level reaches 100, AMS or seizures develop, or if renal failure or pulmonary edema develops  - Continue suicide precautions for now and consult with psychiatry once medically cleared    2. Microcytic anemia  - Hgb is 9.3 on admission, down from 9.9 several days ago when she was seen for heavy menstrual bleeding  - No active bleeding  - She will continue iron supplementation on discharge   3. Asthma  - Patient denies SOB and there is no cough or wheezing on exam    DVT prophylaxis: SCD's  Code Status: Full  Family Communication: Discussed with patient  Consults called: None Admission status:  Observation     Briscoe Deutscher, MD [facility] Pager [phone]  If 7PM-7AM, please contact night-coverage [email or web address] Password [facility]  03/03/2019, 3:35 AM
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
## 2019-03-03 NOTE — Progress Notes (Signed)
CRITICAL VALUE ALERT  Critical Value:  Salicylate 31.7  Date & Time Notied: 03/03/2019 1740  Provider Notified: [facility] on call [facility] floor cvg  Orders Received/Actions taken: await response

## 2019-03-04 ENCOUNTER — Other Ambulatory Visit: Payer: Self-pay | Admitting: Registered Nurse

## 2019-03-04 ENCOUNTER — Other Ambulatory Visit: Payer: Self-pay

## 2019-03-04 ENCOUNTER — Inpatient Hospital Stay (HOSPITAL_COMMUNITY)
Admission: AD | Admit: 2019-03-04 | Discharge: 2019-03-06 | DRG: 885 | Disposition: A | Discharge status: Home / Self Care | Payer: No Typology Code available for payment source | Attending: Psychiatry | Admitting: Psychiatry

## 2019-03-04 ENCOUNTER — Encounter (HOSPITAL_COMMUNITY): Payer: Self-pay

## 2019-03-04 DIAGNOSIS — F1210 Cannabis abuse, uncomplicated: Secondary | ICD-10-CM | POA: Diagnosis present

## 2019-03-04 DIAGNOSIS — Z813 Family history of other psychoactive substance abuse and dependence: Secondary | ICD-10-CM | POA: Diagnosis not present

## 2019-03-04 DIAGNOSIS — Z62820 Parent-biological child conflict: Secondary | ICD-10-CM | POA: Diagnosis present

## 2019-03-04 DIAGNOSIS — Z811 Family history of alcohol abuse and dependence: Secondary | ICD-10-CM | POA: Diagnosis not present

## 2019-03-04 DIAGNOSIS — F17210 Nicotine dependence, cigarettes, uncomplicated: Secondary | ICD-10-CM | POA: Diagnosis present

## 2019-03-04 DIAGNOSIS — F1010 Alcohol abuse, uncomplicated: Secondary | ICD-10-CM

## 2019-03-04 DIAGNOSIS — F322 Major depressive disorder, single episode, severe without psychotic features: Secondary | ICD-10-CM | POA: Diagnosis present

## 2019-03-04 DIAGNOSIS — Z62810 Personal history of physical and sexual abuse in childhood: Secondary | ICD-10-CM | POA: Diagnosis present

## 2019-03-04 DIAGNOSIS — F1220 Cannabis dependence, uncomplicated: Secondary | ICD-10-CM | POA: Diagnosis not present

## 2019-03-04 DIAGNOSIS — T1491XA Suicide attempt, initial encounter: Secondary | ICD-10-CM

## 2019-03-04 DIAGNOSIS — F4310 Post-traumatic stress disorder, unspecified: Secondary | ICD-10-CM | POA: Diagnosis present

## 2019-03-04 DIAGNOSIS — F430 Acute stress reaction: Secondary | ICD-10-CM

## 2019-03-04 DIAGNOSIS — Z915 Personal history of self-harm: Secondary | ICD-10-CM

## 2019-03-04 DIAGNOSIS — F329 Major depressive disorder, single episode, unspecified: Secondary | ICD-10-CM | POA: Diagnosis present

## 2019-03-04 DIAGNOSIS — F32A Depression, unspecified: Secondary | ICD-10-CM | POA: Diagnosis present

## 2019-03-04 DIAGNOSIS — Z818 Family history of other mental and behavioral disorders: Secondary | ICD-10-CM | POA: Diagnosis not present

## 2019-03-04 LAB — BASIC METABOLIC PANEL
Anion gap: 9 (ref 5–15)
BUN: 10 mg/dL (ref 6–20)
CO2: 25 mmol/L (ref 22–32)
Calcium: 8.3 mg/dL — ABNORMAL LOW (ref 8.9–10.3)
Chloride: 100 mmol/L (ref 98–111)
Creatinine, Ser: 0.94 mg/dL (ref 0.44–1.00)
GFR calc Af Amer: >60 mL/min (ref >60)
GFR calc non Af Amer: >60 mL/min (ref >60)
Glucose, Bld: 83 mg/dL (ref 70–99)
Potassium: 3.2 mmol/L — ABNORMAL LOW (ref 3.5–5.1)
Sodium: 134 mmol/L — ABNORMAL LOW (ref 135–145)

## 2019-03-04 LAB — HIV ANTIBODY (ROUTINE TESTING W REFLEX): HIV Screen 4th Generation wRfx: NONREACTIVE

## 2019-03-04 LAB — SALICYLATE LEVEL: Salicylate Lvl: <7 mg/dL (ref 2.8–30.0)

## 2019-03-04 MED ORDER — TRAZODONE HCL 50 MG PO TABS
50.0000 mg | ORAL_TABLET | Freq: Every evening | ORAL | Status: DC | PRN
  Administered 2019-03-04: 50 mg via ORAL
  Filled 2019-03-04: qty 1

## 2019-03-04 MED ORDER — POTASSIUM CHLORIDE CRYS ER 20 MEQ PO TBCR
40.0000 meq | EXTENDED_RELEASE_TABLET | Freq: Two times a day (BID) | ORAL | Status: DC
  Administered 2019-03-04: 40 meq via ORAL
  Filled 2019-03-04: qty 2

## 2019-03-04 NOTE — Progress Notes (Signed)
Suicide risk assessment has been completed several times but Epic will not acknowledge completion.
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
## 2019-03-04 NOTE — Progress Notes (Addendum)
Lauren French (prefers to be call "Q") did not attend wrap-up group provided with encouragement. Pt appears animated/anxious in affect and mood. Pt denies SI/HI/AVH/Pain at this time. Pt requesting PRN for sleep. No new c/o's. Support offered. Will continue with POC.
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
## 2019-03-04 NOTE — TOC Progression Note (Addendum)
Transition of Care [facility]) - Progression Note    Patient Details  Name: Lauren French MRN: [id number] Date of Birth: [birth year]  Transition of Care [facility] CM/SW Contact  Clearance Coots, LCSW Phone Number: 03/04/2019, 1:53 PM  Clinical Narrative:    CSW contacted [facility] for placement.  Patient information under review.  Patient under voluntary. Voluntary form sent to: [phone]  Patient will go to Room 406 Bed1 Nurse call report to: [phone] Attending Dr. Clinton Gallant to transport.       Expected Discharge Plan and Services  [facility]patient [facility]          Expected Discharge Date: 03/06/19                       Social Determinants of Health (SDOH) Interventions  None  Readmission Risk Interventions No flowsheet data found.

## 2019-03-04 NOTE — Consult Note (Addendum)
[facility] Face-to-Face Psychiatry Consult   Reason for Consult: Suicide attempt by drug overdose  Referring Physician:  Dr. Rickey Barbara Patient Identification: Lauren French MRN:  [id number] Principal Diagnosis: Suicide attempt [facility]) Diagnosis:  Principal Problem:   Salicylate overdose Active Problems:   Microcytic anemia   Asthma   Total Time spent with patient: 1 hour  Subjective:   Lauren French is a 28 y.o. female patient admitted with suicide attempt by drug overdose.  HPI:   Per chart review, patient ingested excessive amounts of Aspirin, Advil and alcohol in a suicide attempt. She reports recently moving to this area to be with her significant other. They had an argument yesterday and felt "tired of life" and "tired of being hurt." She took about 50 Aspirin 81 mg tablets and Advil tablets and had three 32 ounce beers and 3 shots of vodka. Her mother called EMS. She was IVC'd by her mother. Poison control was contacted. She was placed on a bicarb drip. Salicylate leve was 21.1 on admission (later increased to 41.7 and is < 7 today). BAL was 60 and UDS was positive for THC.   On interview, Lauren French reports several ongoing stressors including a strained relationship with her mother. She moved to [place] from [place] where she was living with her mother. She reports that yesterday she was talking to her mother on the phone and her mother told her that she was not a wanted pregnancy. She reports that she had several drinks yesterday and "was not in her right mind." She reports intermittent SI but acted on it yesterday in the setting of intoxication. She reports that she kept taking Advil and Aspirin while speaking to her mother. She believes that she took about 2 handfuls of pills. She regrets her attempt. She threw up following the ingestion. She denies current SI, HI or AVH. She endorses worsening mood for the past year. She denies a history of manic symptoms (decreased need for  sleep, increased energy, pressured speech or euphoria). She reports fair appetite and no problems with sleep.    Past Psychiatric History: Denies   Risk to Self:  Yes given serious suicide attempt.  Risk to Others:  None. Denies HI.  Prior Inpatient Therapy:  Denies  Prior Outpatient Therapy:  Denies   Past Medical History:  Past Medical History:  Diagnosis Date  . Asthma     Past Surgical History:  Procedure Laterality Date  . NIPPLE SPARING MASTECTOMY/SENTINAL LYMPH NODE BIOPSY/RECONSTRUCTION/PLACEMENT OF TISSUE EXPANDER     Family History: History reviewed. No pertinent family history. Family Psychiatric  History: Mother-depression, schizophrenia, BPAD and history of multiple suicide attempts.   Social History:  Social History   Substance and Sexual Activity  Alcohol Use Yes   Comment: Social     Social History   Substance and Sexual Activity  Drug Use Yes  . Types: Marijuana    Social History   Socioeconomic History  . Marital status: Single    Spouse name: Not on file  . Number of children: Not on file  . Years of education: Not on file  . Highest education level: Not on file  Occupational History  . Not on file  Social Needs  . Financial resource strain: Not on file  . Food insecurity:    Worry: Not on file    Inability: Not on file  . Transportation needs:    Medical: Not on file    Non-medical: Not on file  Tobacco Use  .  Smoking status: Current Every Day Smoker    Packs/day: 0.20    Types: Cigarettes  . Smokeless tobacco: Never Used  Substance and Sexual Activity  . Alcohol use: Yes    Comment: Social  . Drug use: Yes    Types: Marijuana  . Sexual activity: Yes  Lifestyle  . Physical activity:    Days per week: Not on file    Minutes per session: Not on file  . Stress: Not on file  Relationships  . Social connections:    Talks on phone: Not on file    Gets together: Not on file    Attends religious service: Not on file    Active  member of club or organization: Not on file    Attends meetings of clubs or organizations: Not on file    Relationship status: Not on file  Other Topics Concern  . Not on file  Social History Narrative  . Not on file   Additional Social History: She is from [place], [place]. She has been living in [place] for a month with her partner. She works at [employer]. She reports social alcohol use. She denies illicit substance use.     Allergies:  No Known Allergies  Labs:  Results for orders placed or performed during the hospital encounter of 03/02/19 (from the past 48 hour(s))  Basic metabolic panel     Status: None   Collection Time: 03/02/19  6:45 PM  Result Value Ref Range   Sodium 139 135 - 145 mmol/L   Potassium 3.8 3.5 - 5.1 mmol/L   Chloride 106 98 - 111 mmol/L   CO2 23 22 - 32 mmol/L   Glucose, Bld 74 70 - 99 mg/dL   BUN 10 6 - 20 mg/dL   Creatinine, Ser 4.01 0.44 - 1.00 mg/dL   Calcium 9.3 8.9 - 02.7 mg/dL   GFR calc non Af Amer >60 >60 mL/min   GFR calc Af Amer >60 >60 mL/min   Anion gap 10 5 - 15    Comment: Performed at [facility], [address], [place], [place] [address]  CBC with Differential     Status: Abnormal   Collection Time: 03/02/19  6:45 PM  Result Value Ref Range   WBC 9.1 4.0 - 10.5 K/uL   RBC 4.41 3.87 - 5.11 MIL/uL   Hemoglobin 9.3 (L) 12.0 - 15.0 g/dL   HCT 44.0 (L) 34.7 - 42.5 %   MCV 74.1 (L) 80.0 - 100.0 fL   MCH 21.1 (L) 26.0 - 34.0 pg   MCHC 28.4 (L) 30.0 - 36.0 g/dL   RDW 95.6 (H) 38.7 - 56.4 %   Platelets 385 150 - 400 K/uL   nRBC 0.0 0.0 - 0.2 %   Neutrophils Relative % 70 %   Neutro Abs 6.4 1.7 - 7.7 K/uL   Lymphocytes Relative 23 %   Lymphs Abs 2.1 0.7 - 4.0 K/uL   Monocytes Relative 6 %   Monocytes Absolute 0.5 0.1 - 1.0 K/uL   Eosinophils Relative 0 %   Eosinophils Absolute 0.0 0.0 - 0.5 K/uL   Basophils Relative 0 %   Basophils Absolute 0.0 0.0 - 0.1 K/uL   Immature Granulocytes 1 %   Abs Immature  Granulocytes 0.05 0.00 - 0.07 K/uL    Comment: Performed at [facility], [address], [place], [place] [address]  Ethanol     Status: Abnormal   Collection Time: 03/02/19  6:45 PM  Result  Value Ref Range   Alcohol, Ethyl (B) 60 (H) <10 mg/dL    Comment: (NOTE) Lowest detectable limit for serum alcohol is 10 mg/dL. For medical purposes only. Performed at [facility], [address], [place], [place] [address]   Pregnancy, urine     Status: None   Collection Time: 03/02/19  6:45 PM  Result Value Ref Range   Preg Test, Ur NEGATIVE NEGATIVE    Comment:        THE SENSITIVITY OF THIS METHODOLOGY IS >20 mIU/mL. Performed at [facility], [address], [place], [place] [address]   Rapid urine drug screen (hospital performed)     Status: Abnormal   Collection Time: 03/02/19  6:45 PM  Result Value Ref Range   Opiates NONE DETECTED NONE DETECTED   Cocaine NONE DETECTED NONE DETECTED   Benzodiazepines NONE DETECTED NONE DETECTED   Amphetamines NONE DETECTED NONE DETECTED   Tetrahydrocannabinol POSITIVE (A) NONE DETECTED   Barbiturates NONE DETECTED NONE DETECTED    Comment: (NOTE) DRUG SCREEN FOR MEDICAL PURPOSES ONLY.  IF CONFIRMATION IS NEEDED FOR ANY PURPOSE, NOTIFY LAB WITHIN 5 DAYS. LOWEST DETECTABLE LIMITS FOR URINE DRUG SCREEN Drug Class                     Cutoff (ng/mL) Amphetamine and metabolites    1000 Barbiturate and metabolites    200 Benzodiazepine                 200 Tricyclics and metabolites     300 Opiates and metabolites        300 Cocaine and metabolites        300 THC                            50 Performed at [facility], [address], [place], [place] [address]   Salicylate level     Status: None   Collection Time: 03/02/19  6:45 PM  Result Value Ref Range   Salicylate Lvl 21.1 2.8 - 30.0 mg/dL    Comment: Performed at [facility], [address] [place], [place] [address]  Acetaminophen level     Status: Abnormal   Collection Time: 03/02/19  6:45 PM  Result Value Ref Range   Acetaminophen (Tylenol), Serum <10 (L) 10 - 30 ug/mL    Comment: (NOTE) Therapeutic concentrations vary significantly. A range of 10-30 ug/mL  may be an effective concentration for many patients. However, some  are best treated at concentrations outside of this range. Acetaminophen concentrations >150 ug/mL at 4 hours after ingestion  and >50 ug/mL at 12 hours after ingestion are often associated with  toxic reactions. Performed at [facility], [address], [place], [place] [address]   Hepatic function panel     Status: None   Collection Time: 03/02/19  6:49 PM  Result Value Ref Range   Total Protein 8.0 6.5 - 8.1 g/dL   Albumin 4.2 3.5 - 5.0 g/dL   AST 27 15 - 41 U/L   ALT 19 0 - 44 U/L   Alkaline Phosphatase 67 38 - 126 U/L   Total Bilirubin 0.4 0.3 - 1.2 mg/dL   Bilirubin, Direct <1.3 0.0 - 0.2 mg/dL   Indirect Bilirubin NOT CALCULATED 0.3 - 0.9 mg/dL    Comment: Performed at [facility], [address], [place], [place] [address]  Salicylate level     Status: None   Collection Time: 03/02/19  9:11 PM  Result Value Ref Range   Salicylate Lvl 30.0 2.8 - 30.0 mg/dL    Comment: Performed at [facility], [address] [place], [place] [address]  Salicylate level     Status: Abnormal   Collection Time: 03/02/19 11:30 PM  Result Value Ref Range   Salicylate Lvl 39.1 (HH) 2.8 - 30.0 mg/dL    Comment: CRITICAL RESULT CALLED TO, READ BACK BY AND VERIFIED WITH: Lovett Sox RN [id number] 03/03/19 A NAVARRO Performed at [facility], [address] [place], [place] [address]   Basic metabolic panel     Status: Abnormal   Collection Time: 03/03/19  1:00 AM  Result Value Ref Range   Sodium 138 135 - 145 mmol/L   Potassium 3.8 3.5 - 5.1 mmol/L   Chloride 106 98 - 111 mmol/L    CO2 23 22 - 32 mmol/L   Glucose, Bld 80 70 - 99 mg/dL   BUN 11 6 - 20 mg/dL   Creatinine, Ser 6.83 (H) 0.44 - 1.00 mg/dL   Calcium 9.3 8.9 - 41.9 mg/dL   GFR calc non Af Amer >60 >60 mL/min   GFR calc Af Amer >60 >60 mL/min   Anion gap 9 5 - 15    Comment: Performed at [facility], [address] [place], [place] [address]  Magnesium     Status: None   Collection Time: 03/03/19  1:00 AM  Result Value Ref Range   Magnesium 2.2 1.7 - 2.4 mg/dL    Comment: Performed at [facility], [address] [place], [place] [address]  Blood gas, venous (at [facility] and [facility])     Status: Abnormal   Collection Time: 03/03/19  1:00 AM  Result Value Ref Range   O2 Content ROOM AIR L/min   Delivery systems ROOM AIR    pH, Ven 7.425 7.250 - 7.430   pCO2, Ven 39.4 (L) 44.0 - 60.0 mmHg   pO2, Ven VALUE BELOW REPORTABLE RANGE 32.0 - 45.0 mmHg    Comment: CRITICAL RESULT CALLED TO, READ BACK BY AND VERIFIED WITH: DR Bebe Shaggy AT 0108 BY ANNALISSA AGUSTIN,RRT,RCP ON 03/03/19    Bicarbonate 25.4 20.0 - 28.0 mmol/L   Acid-Base Excess 1.5 0.0 - 2.0 mmol/L   O2 Saturation 46.1 %   Patient temperature 98.6    Collection site VEIN    Drawn by COLLECTED BY NURSE    Sample type VENOUS     Comment: Performed at [facility], [address] [place], [place] [address]  Salicylate level     Status: Abnormal   Collection Time: 03/03/19  2:34 AM  Result Value Ref Range   Salicylate Lvl 41.7 (HH) 2.8 - 30.0 mg/dL    Comment: CRITICAL RESULT CALLED TO, READ BACK BY AND VERIFIED WITHFeliz Beam RN [id number] 03/03/19 A NAVARRO Performed at [facility], [address], [place], [place] [address]   HIV antibody (Routine Testing)     Status: None   Collection Time: 03/03/19  6:11 AM  Result Value Ref Range   HIV Screen 4th Generation wRfx Non Reactive Non Reactive    Comment: (NOTE) Performed At: [facility] [address] [place], [place]  [id number] Jolene Schimke MD YO:[id number]   Comprehensive metabolic panel     Status: Abnormal   Collection Time: 03/03/19  6:11 AM  Result Value  Ref Range   Sodium 140 135 - 145 mmol/L   Potassium 3.3 (L) 3.5 - 5.1 mmol/L   Chloride 104 98 - 111 mmol/L   CO2 24 22 - 32 mmol/L   Glucose, Bld 83 70 - 99 mg/dL   BUN 10 6 - 20 mg/dL   Creatinine, Ser 8.32 (H) 0.44 - 1.00 mg/dL   Calcium 8.8 (L) 8.9 - 10.3 mg/dL   Total Protein 7.7 6.5 - 8.1 g/dL   Albumin 4.0 3.5 - 5.0 g/dL   AST 27 15 - 41 U/L   ALT 20 0 - 44 U/L   Alkaline Phosphatase 66 38 - 126 U/L   Total Bilirubin 0.2 (L) 0.3 - 1.2 mg/dL   GFR calc non Af Amer >60 >60 mL/min   GFR calc Af Amer >60 >60 mL/min   Anion gap 12 5 - 15    Comment: Performed at [facility], [address], [place], [place] [address]  CBC WITH DIFFERENTIAL     Status: Abnormal   Collection Time: 03/03/19  6:11 AM  Result Value Ref Range   WBC 7.7 4.0 - 10.5 K/uL   RBC 4.68 3.87 - 5.11 MIL/uL   Hemoglobin 9.9 (L) 12.0 - 15.0 g/dL   HCT 64.1 (L) 58.3 - 09.4 %   MCV 73.1 (L) 80.0 - 100.0 fL   MCH 21.2 (L) 26.0 - 34.0 pg   MCHC 28.9 (L) 30.0 - 36.0 g/dL   RDW 07.6 (H) 80.8 - 81.1 %   Platelets 418 (H) 150 - 400 K/uL   nRBC 0.0 0.0 - 0.2 %   Neutrophils Relative % 70 %   Neutro Abs 5.4 1.7 - 7.7 K/uL   Lymphocytes Relative 22 %   Lymphs Abs 1.7 0.7 - 4.0 K/uL   Monocytes Relative 7 %   Monocytes Absolute 0.5 0.1 - 1.0 K/uL   Eosinophils Relative 0 %   Eosinophils Absolute 0.0 0.0 - 0.5 K/uL   Basophils Relative 1 %   Basophils Absolute 0.0 0.0 - 0.1 K/uL   Immature Granulocytes 0 %   Abs Immature Granulocytes 0.02 0.00 - 0.07 K/uL    Comment: Performed at [facility], [address], [place], [place] [address]  Salicylate level     Status: Abnormal   Collection Time: 03/03/19  6:11 AM  Result Value Ref Range   Salicylate Lvl 31.7 (HH) 2.8 - 30.0 mg/dL    Comment: CRITICAL RESULT CALLED TO, READ BACK  BY AND VERIFIED WITHHerbie Baltimore RN AT [phone] 03/03/19 MULLINS,T Performed at [facility], [address], [place], [place] [address]   Protime-INR     Status: None   Collection Time: 03/03/19  6:11 AM  Result Value Ref Range   Prothrombin Time 13.3 11.4 - 15.2 seconds   INR 1.0 0.8 - 1.2    Comment: (NOTE) INR goal varies based on device and disease states. Performed at [facility], [address] [place], [place] [address]   Salicylate level     Status: None   Collection Time: 03/03/19  9:05 AM  Result Value Ref Range   Salicylate Lvl 22.3 2.8 - 30.0 mg/dL    Comment: Performed at [facility], [address] [place], [place] [address]  Basic metabolic panel     Status: Abnormal   Collection Time: 03/04/19  5:10 AM  Result Value Ref Range   Sodium 134 (L) 135 - 145 mmol/L  Potassium 3.2 (L) 3.5 - 5.1 mmol/L   Chloride 100 98 - 111 mmol/L   CO2 25 22 - 32 mmol/L   Glucose, Bld 83 70 - 99 mg/dL   BUN 10 6 - 20 mg/dL   Creatinine, Ser 1.61 0.44 - 1.00 mg/dL   Calcium 8.3 (L) 8.9 - 10.3 mg/dL   GFR calc non Af Amer >60 >60 mL/min   GFR calc Af Amer >60 >60 mL/min   Anion gap 9 5 - 15    Comment: Performed at [facility], [address], [place], [place] [address]  Salicylate level     Status: None   Collection Time: 03/04/19  8:08 AM  Result Value Ref Range   Salicylate Lvl <7.0 2.8 - 30.0 mg/dL    Comment: Performed at [facility], [address], [place], [place] [address]    Current Facility-Administered Medications  Medication Dose Route Frequency Provider Last Rate Last Dose  . acetaminophen (TYLENOL) tablet 650 mg  650 mg Oral Q6H PRN Opyd, Lavone Neri, MD       Or  . acetaminophen (TYLENOL) suppository 650 mg  650 mg Rectal Q6H PRN Opyd, Lavone Neri, MD      . ondansetron (ZOFRAN) tablet 4 mg  4 mg Oral Q6H PRN Opyd, Lavone Neri, MD       Or  . ondansetron (ZOFRAN)  injection 4 mg  4 mg Intravenous Q6H PRN Opyd, Lavone Neri, MD      . potassium chloride SA (K-DUR,KLOR-CON) CR tablet 40 mEq  40 mEq Oral BID Jerald Kief, MD   40 mEq at 03/04/19 0945  . sodium chloride flush (NS) 0.9 % injection 3 mL  3 mL Intravenous Q12H Opyd, Lavone Neri, MD   3 mL at 03/03/19 1191    Musculoskeletal: Strength & Muscle Tone: within normal limits Gait & Station: UTA since patient is lying in bed. Patient leans: N/A  Psychiatric Specialty Exam: Physical Exam  Nursing note and vitals reviewed. Constitutional: She is oriented to person, place, and time. She appears well-developed and well-nourished.  HENT:  Head: Normocephalic and atraumatic.  Neck: Normal range of motion.  Respiratory: Effort normal.  Musculoskeletal: Normal range of motion.  Neurological: She is alert and oriented to person, place, and time.  Psychiatric: Her behavior is normal. Thought content normal. Cognition and memory are normal. She expresses impulsivity. She exhibits a depressed mood.    Review of Systems  Cardiovascular: Negative for chest pain.  Gastrointestinal: Negative for abdominal pain, constipation, diarrhea, nausea and vomiting.  Psychiatric/Behavioral: Positive for depression. Negative for hallucinations, substance abuse and suicidal ideas. The patient does not have insomnia.   All other systems reviewed and are negative.   Blood pressure (!) 124/58, pulse 69, temperature 97.7 F (36.5 C), temperature source Oral, resp. rate 18, height  (1.651 m), weight 117.9 kg, last menstrual period 02/24/2019, SpO2 100 %.Body mass index is 43.27 kg/m.  General Appearance: Fairly Groomed, young, African American female, wearing paper hospital scrubs with hair locs who is lying in bed. NAD.   Eye Contact:  Good  Speech:  Clear and Coherent and Normal Rate  Volume:  Normal  Mood:  Depressed  Affect:  Congruent  Thought Process:  Goal Directed, Linear and Descriptions of Associations:  Intact  Orientation:  Full (Time, Place, and Person)  Thought Content:  Logical  Suicidal Thoughts:  No  Homicidal Thoughts:  No  Memory:  Immediate;   Good Recent;  Good Remote;   Good  Judgement:  Fair  Insight:  Fair  Psychomotor Activity:  Normal  Concentration:  Concentration: Good and Attention Span: Good  Recall:  Good  Fund of Knowledge:  Good  Language:  Good  Akathisia:  No  Handed:  Right  AIMS (if indicated):   N/A  Assets:  Communication Skills Desire for Improvement Financial Resources/Insurance Housing Intimacy Physical Health Resilience Social Support  ADL's:  Intact  Cognition:  WNL  Sleep:   Okay   Assessment:  Lauren French is a 28 y.o. female who was admitted after ingesting excessive amounts of Aspirin, Advil and alcohol in a suicide attempt. She endorses worsening mood for the past year and increased alcohol use to cope with ongoing stressors. She warrants inpatient psychiatric hospitalization for stabilization and treatment.   Treatment Plan Summary: -Patient warrants inpatient psychiatric hospitalization given high risk of harm to self. -Continue bedside sitter.  -Defer medication management to inpatient psychiatric team given recent overdose.  -EKG reviewed and QTc 426 on 3/24. Please closely monitor when starting or increasing QTc prolonging agents.  -Recommend checking TSH to rule out mood disorder secondary to thyroid disease.  -Please pursue involuntary commitment if patient refuses voluntary psychiatric hospitalization or attempts to leave the hospital.  -Will sign off on patient at this time. Please consult psychiatry again as needed.     Disposition: Recommend psychiatric Inpatient admission when medically cleared.  Cherly Beach, DO 03/04/2019 10:30 AM
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
## 2019-03-04 NOTE — Progress Notes (Signed)
Pt did not attend wrap-up group   

## 2019-03-05 DIAGNOSIS — F430 Acute stress reaction: Secondary | ICD-10-CM

## 2019-03-05 DIAGNOSIS — F1010 Alcohol abuse, uncomplicated: Secondary | ICD-10-CM

## 2019-03-05 DIAGNOSIS — F329 Major depressive disorder, single episode, unspecified: Secondary | ICD-10-CM | POA: Diagnosis present

## 2019-03-05 DIAGNOSIS — F4310 Post-traumatic stress disorder, unspecified: Secondary | ICD-10-CM

## 2019-03-05 DIAGNOSIS — F32A Depression, unspecified: Secondary | ICD-10-CM | POA: Diagnosis present

## 2019-03-05 DIAGNOSIS — F1220 Cannabis dependence, uncomplicated: Secondary | ICD-10-CM

## 2019-03-05 MED ORDER — ACETAMINOPHEN 325 MG PO TABS: 650.0000 mg | ORAL_TABLET | Freq: Four times a day (QID) | ORAL | Status: DC | PRN

## 2019-03-05 MED ORDER — ALUM & MAG HYDROXIDE-SIMETH 200-200-20 MG/5ML PO SUSP: 30.0000 mL | ORAL | Status: DC | PRN

## 2019-03-05 MED ORDER — HYDROXYZINE HCL 25 MG PO TABS
25.0000 mg | ORAL_TABLET | Freq: Three times a day (TID) | ORAL | Status: DC | PRN
  Administered 2019-03-05: 25 mg via ORAL

## 2019-03-05 MED ORDER — MAGNESIUM HYDROXIDE 400 MG/5ML PO SUSP: 30.0000 mL | Freq: Every day | ORAL | Status: DC | PRN

## 2019-03-05 MED ORDER — POTASSIUM CHLORIDE CRYS ER 20 MEQ PO TBCR
40.0000 meq | EXTENDED_RELEASE_TABLET | Freq: Once | ORAL | Status: AC
  Administered 2019-03-05: 40 meq via ORAL
  Filled 2019-03-05: qty 2

## 2019-03-05 MED ORDER — TRAZODONE HCL 50 MG PO TABS
50.0000 mg | ORAL_TABLET | Freq: Every evening | ORAL | Status: DC | PRN
  Administered 2019-03-05: 50 mg via ORAL

## 2019-03-05 MED ORDER — FERROUS SULFATE 325 (65 FE) MG PO TABS
325.0000 mg | ORAL_TABLET | Freq: Every day | ORAL | Status: DC
  Administered 2019-03-05 – 2019-03-06 (×2): 325 mg via ORAL
  Filled 2019-03-05 (×4): qty 1
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
## 2019-03-05 NOTE — Progress Notes (Signed)
Nursing Progress Note: 7p-7a D: Pt currently presents with a anxious/pleasant affect and behavior. Pt states "My head hurts. I think I have allergies from all the pollen." Interacting appropriately with the milieu. Pt reports good sleep during the previous night with current medication regimen. Pt did attend wrap-up group.  A: Pt provided with medications per providers orders. Pt's labs and vitals were monitored throughout the night. Pt supported emotionally and encouraged to express concerns and questions. Pt educated on medications.  R: Pt's safety ensured with 15 minute and environmental checks. Pt currently denies SI, HI, and AVH. Pt verbally contracts to seek staff if SI,HI, or AVH occurs and to consult with staff before acting on any harmful thoughts. Will continue to monitor.

## 2019-03-05 NOTE — Progress Notes (Signed)
Pt presents with an anxious mood. Pt noted to be silly and animated on approach. Pt denies depression, anxiety and SI/HI. Pt expressed that what she did was stupid and have learned a lesson. Pt stated that her mother is a major stressor and that she's no longer living in the home with her mother. Pt identified basketball, playing video games and talking to her girl friend as coping skills when triggered by her mother. Pt expressed that she can also end negative conversation with her mother. No concerns verbalized by pt.  Medications reviewed with pt. A one time order of potassium ordered per MD for K+ level of 3.2. Verbal support provided. Pt encouraged to attend groups. 15 minute checks performed for safety.  Pt compliant with tx plan.
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
## 2019-03-05 NOTE — Progress Notes (Signed)
Recreation Therapy Notes  Date:  3.27.20 Time: 0930 Location: [address] Dayroom  Group Topic: Stress Management  Goal Area(s) Addresses:  Patient will identify positive stress management techniques. Patient will identify benefits of using stress management post d/c.  Intervention:  Stress Management  Activity :  Guided Imagery.  LRT introduced the stress management technique of guided imagery.  LRT read a script that lead patients on a journey on the beach at sunset.  Patients were to listen and follow along as script was read to engage in activity.  Education:  Stress Management, Discharge Planning.   Education Outcome: Acknowledges Education  Clinical Observations/Feedback:  Pt did not attend group.     Traquan Duarte, LRT/CTRS         Lauren French A 03/05/2019 11:00 AM 

## 2019-03-05 NOTE — Tx Team (Signed)
Interdisciplinary Treatment and Diagnostic Plan Update  03/05/2019 Time of Session:  Lauren French MRN: [id number]  Principal Diagnosis: <principal problem not specified>  Secondary Diagnoses: Active Problems:   MDD (major depressive disorder), severe (HCC)   Depression   Current Medications:  Current Facility-Administered Medications  Medication Dose Route Frequency Provider Last Rate Last Dose  . acetaminophen (TYLENOL) tablet 650 mg  650 mg Oral Q6H PRN Antonieta Pert, MD      . alum & mag hydroxide-simeth (MAALOX/MYLANTA) 200-200-20 MG/5ML suspension 30 mL  30 mL Oral Q4H PRN Antonieta Pert, MD      . ferrous sulfate tablet 325 mg  325 mg Oral Q breakfast Antonieta Pert, MD   325 mg at 03/05/19 1006  . hydrOXYzine (ATARAX/VISTARIL) tablet 25 mg  25 mg Oral TID PRN Antonieta Pert, MD      . magnesium hydroxide (MILK OF MAGNESIA) suspension 30 mL  30 mL Oral Daily PRN Antonieta Pert, MD      . traZODone (DESYREL) tablet 50 mg  50 mg Oral QHS PRN Antonieta Pert, MD       PTA Medications: Medications Prior to Admission  Medication Sig Dispense Refill Last Dose  . ferrous sulfate 325 (65 FE) MG tablet Take 1 tablet (325 mg total) by mouth daily. (Patient taking differently: Take 162.5 mg by mouth daily. ) 30 tablet 0 03/02/2019 at Unknown time    Patient Stressors: Marital or family conflict Traumatic event  Patient Strengths: Ability for insight Active sense of humor Average or above average intelligence  Treatment Modalities: Medication Management, Group therapy, Case management,  1 to 1 session with clinician, Psychoeducation, Recreational therapy.   Physician Treatment Plan for Primary Diagnosis: <principal problem not specified> Long Term Goal(s):     Short Term Goals:    Medication Management: Evaluate patient's response, side effects, and tolerance of medication regimen.  Therapeutic Interventions: 1 to 1 sessions, Unit Group sessions  and Medication administration.  Evaluation of Outcomes: Progressing  Physician Treatment Plan for Secondary Diagnosis: Active Problems:   MDD (major depressive disorder), severe (HCC)   Depression  Long Term Goal(s):     Short Term Goals:       Medication Management: Evaluate patient's response, side effects, and tolerance of medication regimen.  Therapeutic Interventions: 1 to 1 sessions, Unit Group sessions and Medication administration.  Evaluation of Outcomes: Progressing   RN Treatment Plan for Primary Diagnosis: <principal problem not specified> Long Term Goal(s): Knowledge of disease and therapeutic regimen to maintain health will improve  Short Term Goals: Ability to participate in decision making will improve, Ability to verbalize feelings will improve, Ability to disclose and discuss suicidal ideas, Ability to identify and develop effective coping behaviors will improve and Compliance with prescribed medications will improve  Medication Management: RN will administer medications as ordered by provider, will assess and evaluate patient's response and provide education to patient for prescribed medication. RN will report any adverse and/or side effects to prescribing provider.  Therapeutic Interventions: 1 on 1 counseling sessions, Psychoeducation, Medication administration, Evaluate responses to treatment, Monitor vital signs and CBGs as ordered, Perform/monitor CIWA, COWS, AIMS and Fall Risk screenings as ordered, Perform wound care treatments as ordered.  Evaluation of Outcomes: Progressing   LCSW Treatment Plan for Primary Diagnosis: <principal problem not specified> Long Term Goal(s): Safe transition to appropriate next level of care at discharge, Engage patient in therapeutic group addressing interpersonal concerns.  Short Term Goals: Engage patient in aftercare  planning with referrals and resources  Therapeutic Interventions: Assess for all discharge needs, 1 to 1  time with Social worker, Explore available resources and support systems, Assess for adequacy in community support network, Educate family and significant other(s) on suicide prevention, Complete Psychosocial Assessment, Interpersonal group therapy.  Evaluation of Outcomes: Progressing   Progress in Treatment: Attending groups: No. Participating in groups: No. Taking medication as prescribed: Yes. Toleration medication: Yes. Family/Significant other contact made: No, will contact:  patient declined consent for collateral contacts at this time Patient understands diagnosis: Yes. Discussing patient identified problems/goals with staff: Yes. Medical problems stabilized or resolved: Yes. Denies suicidal/homicidal ideation: Yes. Issues/concerns per patient self-inventory: No. Other:   New problem(s) identified:None   New Short Term/Long Term Goal(s): medication stabilization, elimination of SI thoughts, development of comprehensive mental wellness plan.    Patient Goals:     Discharge Plan or Barriers: Patient plans to return home with her girlfriend and wants to continue to follow up with [facility] for outpatient medication management and therapy services at discharge. CSW will continue to follow for additional referrals and possible discharge planning.   Reason for Continuation of Hospitalization: Depression Medication stabilization  Estimated Length of Stay: 03/08/2019  Attendees: Patient: 03/05/2019 10:11 AM  Physician: Dr. Landry Mellow, MD 03/05/2019 10:11 AM  Nursing: Rodell Perna.Lacretia Nicks, RN 03/05/2019 10:11 AM  RN Care Manager: 03/05/2019 10:11 AM  Social Worker: Baldo Daub, LCSWA 03/05/2019 10:11 AM  Recreational Therapist:  03/05/2019 10:11 AM  Other:  03/05/2019 10:11 AM  Other:  03/05/2019 10:11 AM  Other: 03/05/2019 10:11 AM    Scribe for Treatment Team: Maeola Sarah, LCSWA 03/05/2019 10:11 AM

## 2019-03-05 NOTE — [facility] Suicide Risk Assessment (Signed)
[facility] INPATIENT:  Family/Significant Other Suicide Prevention Education  Suicide Prevention Education:  Patient Refusal for Family/Significant Other Suicide Prevention Education: The patient Lauren French has refused to provide written consent for family/significant other to be provided Family/Significant Other Suicide Prevention Education during admission and/or prior to discharge.  Physician notified.  SPE completed with patient, as patient refused to consent to family contact. SPI pamphlet provided to pt and pt was encouraged to share information with support network, ask questions, and talk about any concerns relating to SPE. Patient denies access to guns/firearms and verbalized understanding of information provided. Mobile Crisis information also provided to patient.    Maeola Sarah 03/05/2019, 8:14 AM

## 2019-03-06 MED ORDER — HYDROXYZINE HCL 25 MG PO TABS: 25.0000 mg | ORAL_TABLET | Freq: Three times a day (TID) | ORAL | 0 refills | Status: AC | PRN

## 2019-03-06 MED ORDER — TRAZODONE HCL 50 MG PO TABS: 50.0000 mg | ORAL_TABLET | Freq: Every evening | ORAL | 0 refills | Status: AC | PRN

## 2019-03-06 NOTE — [facility] Group Notes (Signed)
  [facility] LCSW Group Therapy Note  Date/Time:  03/06/2019 10:15AM-11:15AM  Type of Therapy and Topic:  Group Therapy:  Feelings About Hospitalization  Participation Level:  Active   Description of Group This process group involved patients discussing their feelings related to being hospitalized, as well as the benefits they see to being in the hospital.  These feelings and benefits were itemized.  The group then brainstormed specific ways in which they could seek those same benefits when they discharge and return home.  This was related specifically to the current social distancing needs facing the country.  Therapeutic Goals 1. Patient will identify and describe positive and negative feelings related to hospitalization 2. Patient will verbalize benefits of hospitalization to themselves personally 3. Patients will brainstorm together ways they can obtain similar benefits in the outpatient setting, identify barriers to wellness and possible solutions  4. Patients will develop ideas together and separately for how to socially distance themselves while enhancing their mental health and avoiding isolation.  Summary of Patient Progress:  The patient expressed her primary feelings about being hospitalized are that it has helped, but she would rather be at home.  She stated she needs to find "better outlets" and "never do what I did again."  She talked about previous coping skills involving numbers of people, such as playing basketball or going to friends' homes to play video games, and the group brainstormed ways in which she can pursue these types of activities without leaving home or violating the need to socially distance herself.  This included looking online for basketball tips, lifting weights through online tutorials, and buying her own gaming system.  Therapeutic Modalities Cognitive Behavioral Therapy Motivational Interviewing    Ambrose Mantle, LCSW 03/06/2019, 1:14 PM

## 2019-03-06 NOTE — Progress Notes (Signed)
D: Pt A & O X 3. Denies SI, HI, AVH and pain at this time. Patient is bright, cheerful, and excited about discharge. She feels she is not ready to return to work until [employer], and SW notified. She slept well. D/C home as ordered. Picked up in lobby by  A: D/C instructions reviewed with pt including prescriptions and follow up appointment, compliance encouraged. Patient has no belongings in lockup. Scheduled and PRN medications given with verbal education and effects monitored. Safety checks maintained without incident till time of d/c.  R: Pt receptive to care. Compliant with medications when offered. Denies adverse drug reactions when assessed. Verbalized understanding related to d/c instructions. Signed belonging sheet in agreement with items received from locker. Ambulatory with a steady gait. Appears to be in no physical distress at time of departure.

## 2019-03-06 NOTE — Discharge Summary (Signed)
Physician Discharge Summary Note  Patient:  Lauren French is an 28 y.o., female MRN:  [id number] DOB:  August 12, 1991 Patient phone:  [phone] (home)  Patient address:   [address] [place] [address],  Total Time spent with patient: 15 minutes  Date of Admission:  03/04/2019 Date of Discharge: 03/06/2019  Reason for Admission:  Per assessment note: Lauren French is a 28 year old female presenting for treatment after overdose on 50 of 81 mg ASA and Advil tablets along with 3 32 oz beers and 3 shots of vodka. She required medical admission with IVF and sodium bicarb. Admission BAL 60, salicyclate level 41.7 on 03/03/19. She reports trigger for overdose was argument with mother. She was raised by grandparents during childhood due to mother's mental health problems (mother dx with bipolar disorder, schizophrenia) and has history of strained relationship with mother. Patient reports speaking on the phone with her mother for the first time in months on Tuesday, and mother said degrading things, including that she wished the patient was never born. Patient became angry and upset, started drinking and then took the overdose. She minimizes overdose and denies distant or recent history of depression, says "It was just a bad day." She reports she typically drinks socially, denies drinking excessively. States she does not drink alone normally but did on the day of admission due to being upset with her mother. She reports THC use, denies other drug use. UDS positive THC only. Denies SI/HI/AVH.   Principal Problem: MDD (major depressive disorder), severe (HCC) Discharge Diagnoses: Principal Problem:   MDD (major depressive disorder), severe (HCC) Active Problems:   Depression   Acute stress reaction   Posttraumatic stress disorder   Moderate cannabis use disorder (HCC)   Alcohol abuse   Past Psychiatric History:   Past Medical History:  Past Medical History:  Diagnosis Date  .  Asthma     Past Surgical History:  Procedure Laterality Date  . NIPPLE SPARING MASTECTOMY/SENTINAL LYMPH NODE BIOPSY/RECONSTRUCTION/PLACEMENT OF TISSUE EXPANDER    . NO PAST SURGERIES     Family History: History reviewed. No pertinent family history. Family Psychiatric  History:  Social History:  Social History   Substance and Sexual Activity  Alcohol Use Yes   Comment: Social     Social History   Substance and Sexual Activity  Drug Use Yes  . Types: Marijuana    Social History   Socioeconomic History  . Marital status: Single    Spouse name: Not on file  . Number of children: Not on file  . Years of education: Not on file  . Highest education level: Not on file  Occupational History  . Not on file  Social Needs  . Financial resource strain: Not on file  . Food insecurity:    Worry: Not on file    Inability: Not on file  . Transportation needs:    Medical: Not on file    Non-medical: Not on file  Tobacco Use  . Smoking status: Current Every Day Smoker    Packs/day: 0.20    Types: Cigarettes  . Smokeless tobacco: Never Used  Substance and Sexual Activity  . Alcohol use: Yes    Comment: Social  . Drug use: Yes    Types: Marijuana  . Sexual activity: Yes  Lifestyle  . Physical activity:    Days per week: Not on file    Minutes per session: Not on file  . Stress: Not on file  Relationships  . Social  connections:    Talks on phone: Not on file    Gets together: Not on file    Attends religious service: Not on file    Active member of club or organization: Not on file    Attends meetings of clubs or organizations: Not on file    Relationship status: Not on file  Other Topics Concern  . Not on file  Social History Narrative  . Not on file    Hospital Course:  Saline Demetrius was admitted for MDD (major depressive disorder), severe (HCC) and crisis management.  Pt was treated discharged with the medications listed below under Medication List.  Medical  problems were identified and treated as needed.  Home medications were restarted as appropriate.  Improvement was monitored by observation and Theone Stanley 's daily report of symptom reduction.  Emotional and mental status was monitored by daily self-inventory reports completed by Theone Stanley and clinical staff.         Shelma Millwood was evaluated by the treatment team for stability and plans for continued recovery upon discharge. Rabiya Chicoine 's motivation was an integral factor for scheduling further treatment. Employment, transportation, bed availability, health status, family support, and any pending legal issues were also considered during hospital stay. Pt was offered further treatment options upon discharge including but not limited to Residential, Intensive Outpatient, and Outpatient treatment.  Ladavia Waymon will follow up with the services as listed below under Follow Up Information.     Upon completion of this admission the patient was both mentally and medically stable for discharge denying suicidal/homicidal ideation, auditory/visual/tactile hallucinations, delusional thoughts and paranoia.    Rhilynn Meckler responded well to treatment with  Trazdone 50 mg without adverse effects. Pt demonstrated improvement without reported or observed adverse effects to the point of stability appropriate for outpatient management. Pertinent labs include BMP, CBC  for which outpatient follow-up is necessary for lab recheck as mentioned below. Reviewed CBC, CMP, BAL, and UDS; all unremarkable aside from noted exceptions.   Physical Findings: AIMS:  , ,  ,  ,    CIWA:    COWS:     Musculoskeletal: Strength & Muscle Tone: within normal limits Gait & Station: normal Patient leans: N/A  Psychiatric Specialty Exam: See SRA by MD  Physical Exam  Nursing note and vitals reviewed. Constitutional: She appears well-developed.  Psychiatric: She has a normal mood and affect.  Her behavior is normal.    Review of Systems  Psychiatric/Behavioral: Negative for depression (stable) and suicidal ideas. The patient is not nervous/anxious.   All other systems reviewed and are negative.   Blood pressure 131/82, pulse 84, temperature (!) 97.5 F (36.4 C), temperature source Oral, resp. rate 18, height 5\' 5"  (1.651 m), weight 97.5 kg, last menstrual period 02/24/2019, SpO2 100 %.Body mass index is 35.78 kg/m.    Have you used any form of tobacco in the last 30 days? (Cigarettes, Smokeless Tobacco, Cigars, and/or Pipes): Yes  Has this patient used any form of tobacco in the last 30 days? (Cigarettes, Smokeless Tobacco, Cigars, and/or Pipes) Yes, Yes, A prescription for an FDA-approved tobacco cessation medication was offered at discharge and the patient refused  Blood Alcohol level:  Lab Results  Component Value Date   ETH 60 (H) 03/02/2019    Metabolic Disorder Labs:  No results found for: HGBA1C, MPG No results found for: PROLACTIN No results found for: CHOL, TRIG, HDL, CHOLHDL, VLDL, LDLCALC  See Psychiatric Specialty Exam and Suicide Risk Assessment  completed by Attending Physician prior to discharge.  Discharge destination:  Home  Is patient on multiple antipsychotic therapies at discharge:  No   Has Patient had three or more failed trials of antipsychotic monotherapy by history:  No  Recommended Plan for Multiple Antipsychotic Therapies: NA  Discharge Instructions    Diet - low sodium heart healthy   Complete by:  As directed    Discharge instructions   Complete by:  As directed    Take all medications as prescribed. Keep all follow-up appointments as scheduled.  Do not consume alcohol or use illegal drugs while on prescription medications. Report any adverse effects from your medications to your primary care provider promptly.  In the event of recurrent symptoms or worsening symptoms, call 911, a crisis hotline, or go to the nearest emergency  department for evaluation.   Increase activity slowly   Complete by:  As directed      Allergies as of 03/06/2019   No Known Allergies     Medication List    TAKE these medications     Indication  ferrous sulfate 325 (65 FE) MG tablet Take 1 tablet (325 mg total) by mouth daily. What changed:  how much to take  Indication:  Iron Deficiency, Anemia From Inadequate Iron in the Body   hydrOXYzine 25 MG tablet Commonly known as:  ATARAX/VISTARIL Take 1 tablet (25 mg total) by mouth 3 (three) times daily as needed for anxiety.  Indication:  Feeling Anxious   traZODone 50 MG tablet Commonly known as:  DESYREL Take 1 tablet (50 mg total) by mouth at bedtime as needed for sleep.  Indication:  Drug-Induced Difficulty in Voluntary Movement      Follow-up Information    [facility] Follow up on 03/11/2019.   Why:  Hospital follow up appointment is Thursday, 4/2 at 10:00a.  At this time your appointment will be conducted over the telephone.  Contact information: [address] [place] [address] [phone]           Follow-up recommendations:  Activity:  as tolerated Diet:  heart healthy   Comments:    Take all medications as prescribed. Keep all follow-up appointments as scheduled.  Do not consume alcohol or use illegal drugs while on prescription medications. Report any adverse effects from your medications to your primary care provider promptly.  In the event of recurrent symptoms or worsening symptoms, call 911, a crisis hotline, or go to the nearest emergency department for evaluation.   Signed: Oneta Rack, NP 03/06/2019, 8:51 AM

## 2019-03-06 NOTE — Progress Notes (Signed)
  [facility] Adult Case Management Discharge Plan :  Will you be returning to the same living situation after discharge:  Yes,  with girlfriend At discharge, do you have transportation home?: Yes,  arranged by patient Do you have the ability to pay for your medications: No.  Release of information consent forms completed and turned in to Medical Records by CSW.   Patient to Follow up at: Follow-up Information    [facility] Follow up on 03/11/2019.   Why:  Hospital follow up appointment is Thursday, 4/2 at 10:00a.  At this time your appointment will be conducted over the telephone.  Contact information: [address] [place] [address] [phone]           Next level of care provider has access to [facility] Link:no  Safety Planning and Suicide Prevention discussed: No.  Declined by patient, so was reviewed thoroughly with patient who was also given a brochure and encouraged to share with loved ones  Have you used any form of tobacco in the last 30 days? (Cigarettes, Smokeless Tobacco, Cigars, and/or Pipes): Yes  Has patient been referred to the Quitline?: Patient refused referral  Patient has been referred for addiction treatment: N/A  Lynnell Chad, LCSW 03/06/2019, 8:57 AM

## 2019-03-06 NOTE — [facility] Suicide Risk Assessment (Signed)
[facility] Discharge Suicide Risk Assessment   Principal Problem: MDD (major depressive disorder), severe (HCC) Discharge Diagnoses: Principal Problem:   MDD (major depressive disorder), severe (HCC) Active Problems:   Depression   Acute stress reaction   Posttraumatic stress disorder   Moderate cannabis use disorder (HCC)   Alcohol abuse   Total Time spent with patient: 15 minutes  Musculoskeletal: Strength & Muscle Tone: within normal limits Gait & Station: normal Patient leans: N/A  Psychiatric Specialty Exam: Review of Systems  All other systems reviewed and are negative.   Blood pressure 131/82, pulse 84, temperature (!) 97.5 F (36.4 C), temperature source Oral, resp. rate 18, height 5\' 5"  (1.651 m), weight 97.5 kg, last menstrual period 02/24/2019, SpO2 100 %.Body mass index is 35.78 kg/m.  General Appearance: Casual  Eye Contact::  Fair  Speech:  Normal Rate409  Volume:  Normal  Mood:  Euthymic  Affect:  Congruent  Thought Process:  Coherent and Descriptions of Associations: Intact  Orientation:  Full (Time, Place, and Person)  Thought Content:  Logical  Suicidal Thoughts:  No  Homicidal Thoughts:  No  Memory:  Immediate;   Fair Recent;   Fair Remote;   Fair  Judgement:  Intact  Insight:  Fair  Psychomotor Activity:  Normal  Concentration:  Good  Recall:  Good  Fund of Knowledge:Good  Language: Good  Akathisia:  Negative  Handed:  Right  AIMS (if indicated):     Assets:  Communication Skills Desire for Improvement Housing Leisure Time Physical Health Resilience Social Support  Sleep:  Number of Hours: 6.75  Cognition: WNL  ADL's:  Intact   Mental Status Per Nursing Assessment::   On Admission:  NA  Demographic Factors:  Gay, lesbian, or bisexual orientation  Loss Factors: NA  Historical Factors: Impulsivity  Risk Reduction Factors:   Living with another person, especially a relative and Positive social support  Continued Clinical  Symptoms:  Depression:   Impulsivity  Cognitive Features That Contribute To Risk:  None    Suicide Risk:  Minimal: No identifiable suicidal ideation.  Patients presenting with no risk factors but with morbid ruminations; may be classified as minimal risk based on the severity of the depressive symptoms  Follow-up Information    [facility] Follow up on 03/11/2019.   Why:  Hospital follow up appointment is Thursday, 4/2 at 10:00a.  At this time your appointment will be conducted over the telephone.  Contact information: [address] [place] [address] [phone]           Plan Of Care/Follow-up recommendations:  Activity:  ad lib  Antonieta Pert, MD 03/06/2019, 7:37 AM

## 2019-06-09 ENCOUNTER — Other Ambulatory Visit: Payer: Self-pay

## 2019-06-09 ENCOUNTER — Emergency Department (HOSPITAL_COMMUNITY)
Admission: EM | Admit: 2019-06-09 | Discharge: 2019-06-09 | Disposition: A | Discharge status: Home / Self Care | Payer: Self-pay | Attending: Emergency Medicine | Admitting: Emergency Medicine

## 2019-06-09 ENCOUNTER — Encounter (HOSPITAL_COMMUNITY): Payer: Self-pay | Admitting: Emergency Medicine

## 2019-06-09 DIAGNOSIS — Z79899 Other long term (current) drug therapy: Secondary | ICD-10-CM | POA: Insufficient documentation

## 2019-06-09 DIAGNOSIS — Z23 Encounter for immunization: Secondary | ICD-10-CM | POA: Insufficient documentation

## 2019-06-09 DIAGNOSIS — F17210 Nicotine dependence, cigarettes, uncomplicated: Secondary | ICD-10-CM | POA: Insufficient documentation

## 2019-06-09 DIAGNOSIS — J45909 Unspecified asthma, uncomplicated: Secondary | ICD-10-CM | POA: Insufficient documentation

## 2019-06-09 DIAGNOSIS — L02412 Cutaneous abscess of left axilla: Secondary | ICD-10-CM | POA: Insufficient documentation

## 2019-06-09 MED ORDER — TETANUS-DIPHTH-ACELL PERTUSSIS 5-2.5-18.5 LF-MCG/0.5 IM SUSP
0.5000 mL | Freq: Once | INTRAMUSCULAR | Status: AC
  Administered 2019-06-09: 0.5 mL via INTRAMUSCULAR
  Filled 2019-06-09: qty 0.5

## 2019-06-09 MED ORDER — DICLOFENAC SODIUM 1 % TD GEL: 2.0000 g | Freq: Four times a day (QID) | TRANSDERMAL | 0 refills | Status: AC

## 2019-06-09 MED ORDER — LIDOCAINE-EPINEPHRINE (PF) 2 %-1:200000 IJ SOLN
10.0000 mL | Freq: Once | INTRAMUSCULAR | Status: AC
  Administered 2019-06-09: 10 mL
  Filled 2019-06-09: qty 20

## 2019-06-09 MED ORDER — CEPHALEXIN 500 MG PO CAPS: 500.0000 mg | ORAL_CAPSULE | Freq: Four times a day (QID) | ORAL | 0 refills | Status: AC

## 2019-06-09 NOTE — Discharge Instructions (Signed)
Wound Care - After I&D of Abscess  You may remove the bandage after 24 hours.  The only reason to replace the bandage is to protect clothing from drainage. Bandages, if used, should be replaced daily or whenever soiled. The wound may continue to drain for the next 2-3 days.   Cleaning: Clean the wound and surrounding area gently with tap water and mild soap. Rinse well and blot dry. You may shower normally. Soaking the wound in Epsom salt baths for no more than 15 minutes once a day may help rinse out any remaining pus and help with wound healing.  Clean the wound daily to prevent further infection. Do not use cleaners such as hydrogen peroxide or alcohol.   Scar reduction: Application of a topical antibiotic ointment, such as Neosporin, after the wound has begun to close and heal well can decrease scab formation and reduce scarring. After the wound has healed, application of ointments such as Aquaphor can also reduce scar formation.  The key to scar reduction is keeping the skin well hydrated and supple. Drinking plenty of water throughout the day (At least eight 8oz glasses of water a day) is essential to staying well hydrated.  Sun exposure: Keep the wound out of the sun. After the wound has healed, continue to protect it from the sun by wearing protective clothing or applying sunscreen.  Pain: You may use Tylenol, naproxen, or ibuprofen for pain. Diclofenac gel: This is a topical anti-inflammatory medication and can be applied directly to the painful region, but not into the wound.  Do not use on the face or genitals.  This medication may be used as an alternative to oral anti-inflammatory medications, such as ibuprofen or naproxen.  Prevention: There is some people that have a predisposition to abscess formation, however, there are some things that can be done to prevent abscesses in many people.  Most abscesses form because bacteria that naturally lives on the skin gets trapped underneath the  skin.  This can occur through openings too small to see. Before and after any area of skin is shaved, wax, or abraded in any manner, the area should be washed with soap and water and rinsed well.   If you are having trouble with recurrent abscesses, it may be wise to perform a chlorhexidine wash regimen.  For 1 week, wash all of your body with chlorhexidine (available over-the-counter at most pharmacies). You may also need to reevaluate your use of daily soap as soaps with perfumes or dyes can increase the chances of infection in some people.  Follow up: Please return to the ED or go to your primary care provider in 2-3 days for a wound check to assure proper healing.  Return: Return to the ED sooner should signs of worsening infection arise, such as spreading redness, worsening puffiness/swelling, severe increase in pain, fever over 100.65F, or any other major issues.  For prescription assistance, may try using prescription discount sites or apps, such as goodrx.com

## 2019-06-09 NOTE — ED Triage Notes (Signed)
Pt arrives from home with c/o of abscess in left arm pit- began 2 days ago.

## 2019-06-09 NOTE — ED Provider Notes (Signed)
[facility] Provider Note   CSN: [id number] Arrival date & time: 06/09/19  1553    History   Chief Complaint Chief Complaint  Patient presents with   Abscess    HPI Lauren French is a 28 y.o. female.     HPI   Lauren StanleyShaquisia Vallin is a 28 y.o. female, with a history of asthma, presenting to the ED with a suspected abscess in the left axillary region noted 2 days ago.  She notes some swelling, pain, and tenderness.  She has had skin abscesses in the past, but none in her axilla.  She has been trying warm compresses without improvement.  Pain is sharp, moderate to severe, nonradiating.  Patient also notes she has had a lymph node removed from this exact area several years ago, which makes her think even more that this might be an abscess rather than swollen lymph node. Denies fever/chills, numbness, weakness, chest pain, shortness of breath, other areas of swelling, or any other complaints.  Past Medical History:  Diagnosis Date   Asthma     Patient Active Problem List   Diagnosis Date Noted   Depression 03/05/2019   Acute stress reaction    Posttraumatic stress disorder    Moderate cannabis use disorder (HCC)    Alcohol abuse    MDD (major depressive disorder), severe (HCC) 03/04/2019   Suicide attempt (HCC)    Salicylate overdose 03/03/2019   Microcytic anemia 03/03/2019   Asthma 03/03/2019    Past Surgical History:  Procedure Laterality Date   NIPPLE SPARING MASTECTOMY/SENTINAL LYMPH NODE BIOPSY/RECONSTRUCTION/PLACEMENT OF TISSUE EXPANDER     NO PAST SURGERIES       OB History   No obstetric history on file.      Home Medications    Prior to Admission medications   Medication Sig Start Date End Date Taking? Authorizing Provider  cephALEXin (KEFLEX) 500 MG capsule Take 1 capsule (500 mg total) by mouth 4 (four) times daily. 06/09/19   Jerik Falletta C, PA-C  diclofenac sodium (VOLTAREN) 1 % GEL Apply 2 g  topically 4 (four) times daily. 06/09/19   Stpehen Petitjean C, PA-C  ferrous sulfate 325 (65 FE) MG tablet Take 1 tablet (325 mg total) by mouth daily. Patient taking differently: Take 162.5 mg by mouth daily.  02/27/19   Muthersbaugh, Dahlia ClientHannah, PA-C  hydrOXYzine (ATARAX/VISTARIL) 25 MG tablet Take 1 tablet (25 mg total) by mouth 3 (three) times daily as needed for anxiety. 03/06/19   Oneta RackLewis, Tanika N, NP  traZODone (DESYREL) 50 MG tablet Take 1 tablet (50 mg total) by mouth at bedtime as needed for sleep. 03/06/19   Oneta RackLewis, Tanika N, NP    Family History No family history on file.  Social History Social History   Tobacco Use   Smoking status: Current Every Day Smoker    Packs/day: 0.20    Types: Cigarettes   Smokeless tobacco: Never Used  Substance Use Topics   Alcohol use: Yes    Comment: Social   Drug use: Yes    Types: Marijuana     Allergies   Patient has no known allergies.   Review of Systems Review of Systems  Constitutional: Negative for fever.  Skin:       Area of pain and swelling in the left axilla  Neurological: Negative for weakness and numbness.     Physical Exam Updated Vital Signs BP 132/79 (BP Location: Right Arm)    Pulse 78    Temp  99 F (37.2 C) (Oral)    Resp 16    SpO2 100%   Physical Exam Vitals signs and nursing note reviewed.  Constitutional:      General: She is not in acute distress.    Appearance: She is well-developed. She is not diaphoretic.  HENT:     Head: Normocephalic and atraumatic.  Eyes:     Conjunctiva/sclera: Conjunctivae normal.  Neck:     Musculoskeletal: Neck supple.  Cardiovascular:     Rate and Rhythm: Normal rate and regular rhythm.     Pulses:          Radial pulses are 2+ on the left side.  Pulmonary:     Effort: Pulmonary effort is normal.  Musculoskeletal:     Comments: Area of fluctuance approximately 1.5 cm across to the left axilla combined with some swelling and tenderness.  There is surrounding tenderness  without other noted areas of fluctuance. Full range of motion in the left shoulder without noted difficulty, though this does cause pain in the left axilla.  Skin:    General: Skin is warm and dry.     Coloration: Skin is not pale.  Neurological:     Mental Status: She is alert.     Comments: Sensation grossly intact to light touch through each of the nerve distributions of the bilateral upper extremities. Abduction and adduction of the fingers intact against resistance. Grip strength equal bilaterally. Supination and pronation intact against resistance. Strength 5/5 through the cardinal directions of the bilateral wrists. Strength 5/5 with flexion and extension of the bilateral elbows. Patient can touch the thumb to each one of the fingertips without difficulty.   Psychiatric:        Behavior: Behavior normal.      ED Treatments / Results  Labs (all labs ordered are listed, but only abnormal results are displayed) Labs Reviewed - No data to display  EKG None  Radiology No results found.  Procedures Ultrasound ED Soft Tissue  Date/Time: 06/09/2019 4:48 PM Performed by: Lorayne Bender, PA-C Authorized by: Lorayne Bender, PA-C   Procedure details:    Indications: localization of abscess and evaluate for cellulitis     Transverse view:  Visualized   Longitudinal view:  Visualized   Images: archived   Location:    Location: axilla     Side:  Left Findings:     abscess present    cellulitis present .Marland KitchenIncision and Drainage  Date/Time: 06/09/2019 5:40 PM Performed by: Lorayne Bender, PA-C Authorized by: Lorayne Bender, PA-C   Consent:    Consent obtained:  Verbal   Consent given by:  Patient   Risks discussed:  Bleeding, incomplete drainage, pain and infection Location:    Type:  Abscess   Size:  1.5cm   Location:  Upper extremity (left axilla) Pre-procedure details:    Skin preparation:  Betadine Anesthesia (see MAR for exact dosages):    Anesthesia method:  Local  infiltration   Local anesthetic:  Lidocaine 2% WITH epi Procedure type:    Complexity:  Complex Procedure details:    Incision types:  Cruciate   Incision depth:  Subcutaneous   Scalpel blade:  11   Wound management:  Probed and deloculated, irrigated with saline and extensive cleaning   Drainage:  Purulent   Drainage amount:  Moderate   Wound treatment:  Wound left open   Packing materials:  None Post-procedure details:    Patient tolerance of procedure:  Tolerated well,  no immediate complications   (including critical care time)  Medications Ordered in ED Medications  Tdap (BOOSTRIX) injection 0.5 mL (has no administration in time range)  lidocaine-EPINEPHrine (XYLOCAINE W/EPI) 2 %-1:200000 (PF) injection 10 mL (10 mLs Infiltration Given by Other 06/09/19 1742)     Initial Impression / Assessment and Plan / ED Course  I have reviewed the triage vital signs and the nursing notes.  Pertinent labs & imaging results that were available during my care of the patient were reviewed by me and considered in my medical decision making (see chart for details).        Patient presents with an area of painful swelling to the left axilla.  Abscess and cellulitis identified on ultrasound.  I&D without immediate complication. The patient was given instructions for home care as well as return precautions. Patient voices understanding of these instructions, accepts the plan, and is comfortable with discharge.  Final Clinical Impressions(s) / ED Diagnoses   Final diagnoses:  Abscess of left axilla    ED Discharge Orders         Ordered    cephALEXin (KEFLEX) 500 MG capsule  4 times daily     06/09/19 1750    diclofenac sodium (VOLTAREN) 1 % GEL  4 times daily     06/09/19 1750           Anselm PancoastJoy, Gaelyn Tukes C, PA-C 06/09/19 1803    Margarita Grizzleay, Danielle, MD 06/10/19 1421

## 2020-11-13 IMAGING — US US PELVIS COMPLETE
1 series · 13 of 25 positions shown · non-contrast
Comparison: None available.

CLINICAL DATA: Initial evaluation for acute left lower quadrant
abdominal pain, vaginal bleeding.

EXAM:
TRANSABDOMINAL AND TRANSVAGINAL ULTRASOUND OF PELVIS
DOPPLER ULTRASOUND OF OVARIES
TECHNIQUE: Both transabdominal and transvaginal ultrasound examinations of the
pelvis were performed. Transabdominal technique was performed for
global imaging of the pelvis including uterus, ovaries, adnexal
regions, and pelvic cul-de-sac.
It was necessary to proceed with endovaginal exam following the
transabdominal exam to visualize the uterus, endometrium, and
ovaries. Color and duplex Doppler ultrasound was utilized to
evaluate blood flow to the ovaries.

[Series 1: us pelvis complete · 13 of 36 slices shown]
[im 1/36]
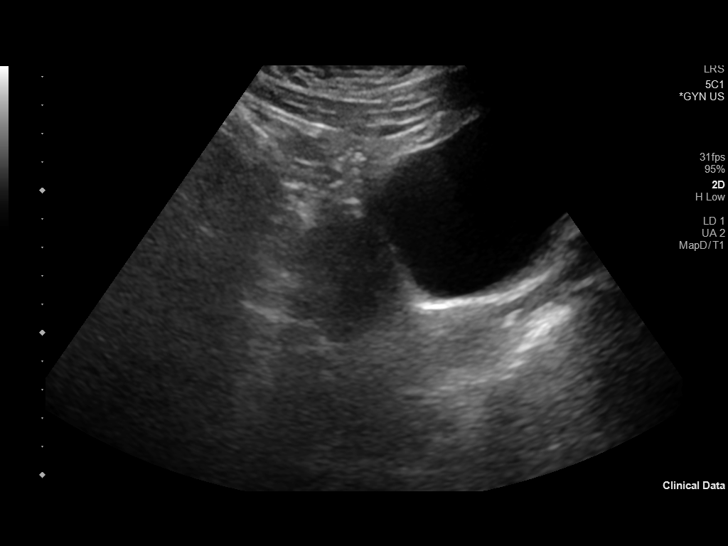
[im 3/36]
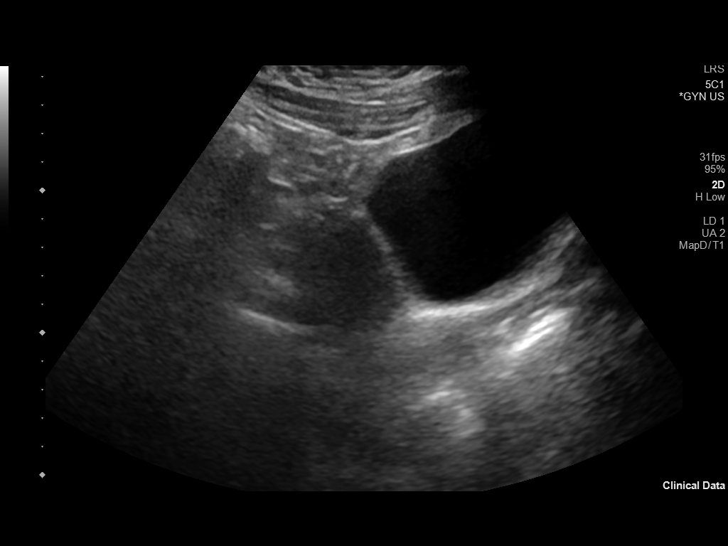
[im 6/36]
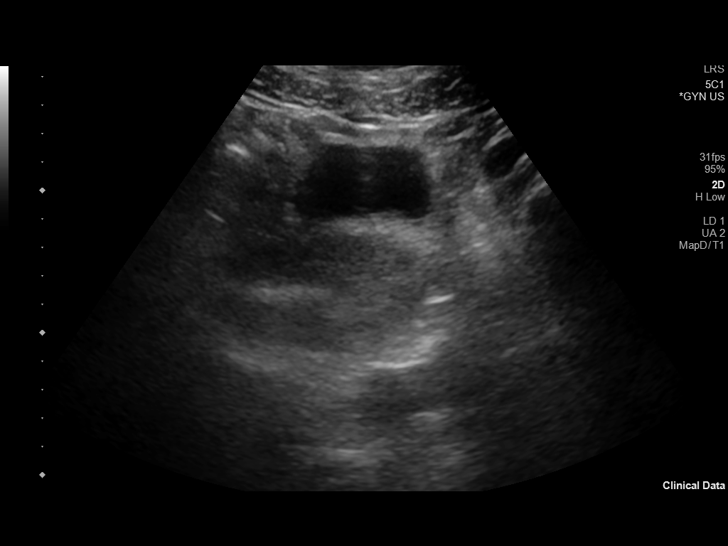
[im 9/36]
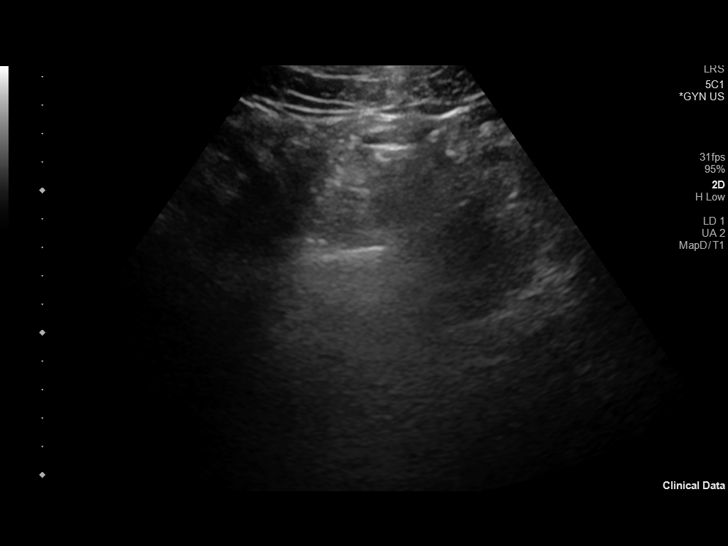
[im 12/36]
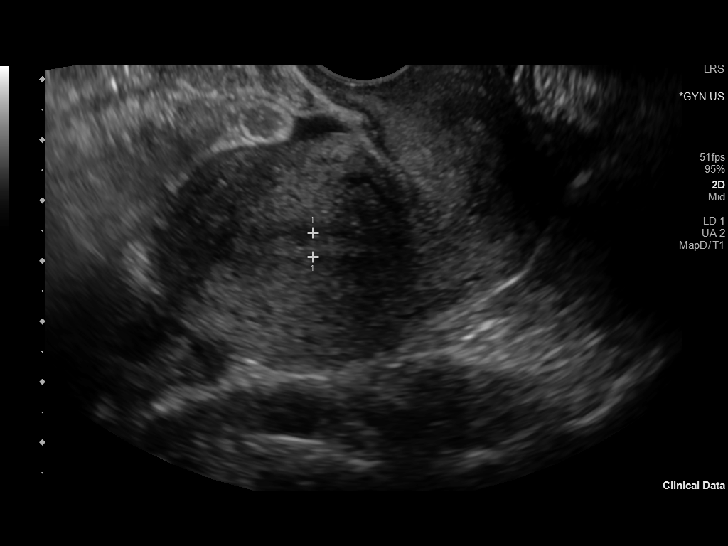
[im 15/36]
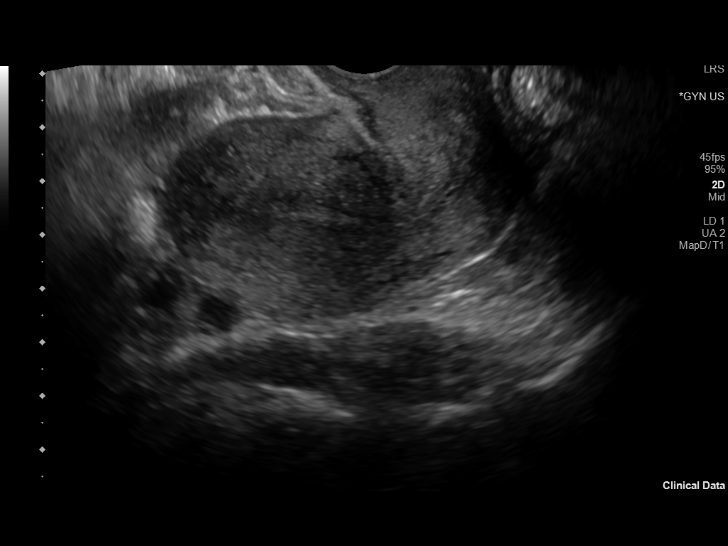
[im 18/36]
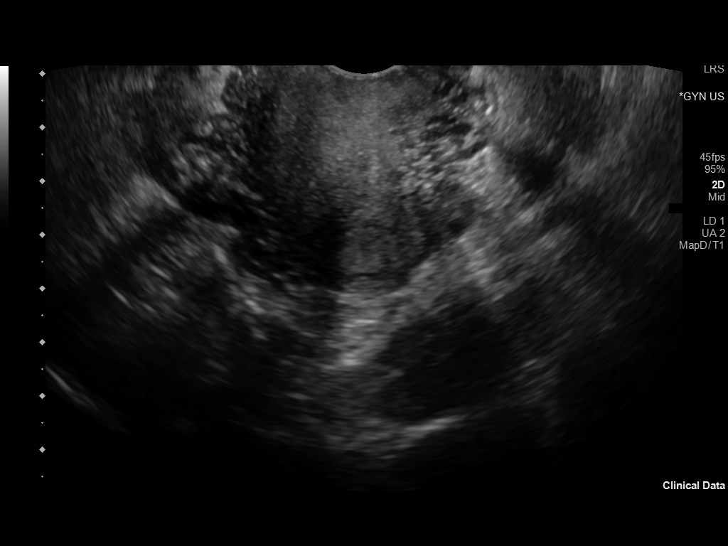
[im 21/36]
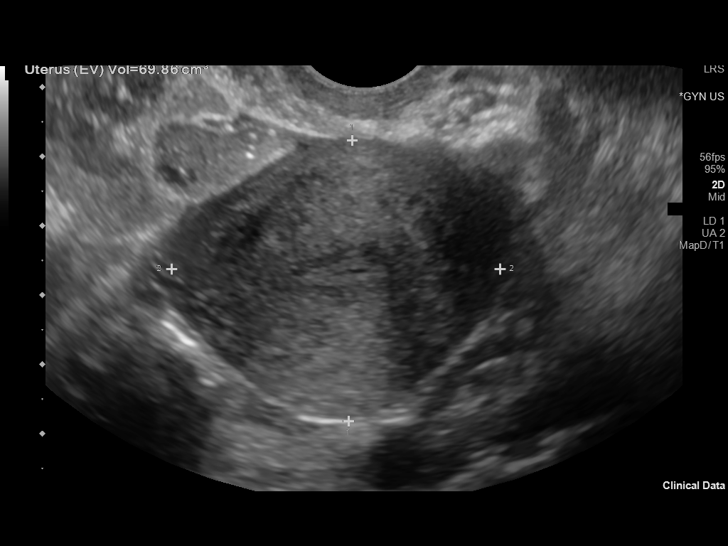
[im 24/36]
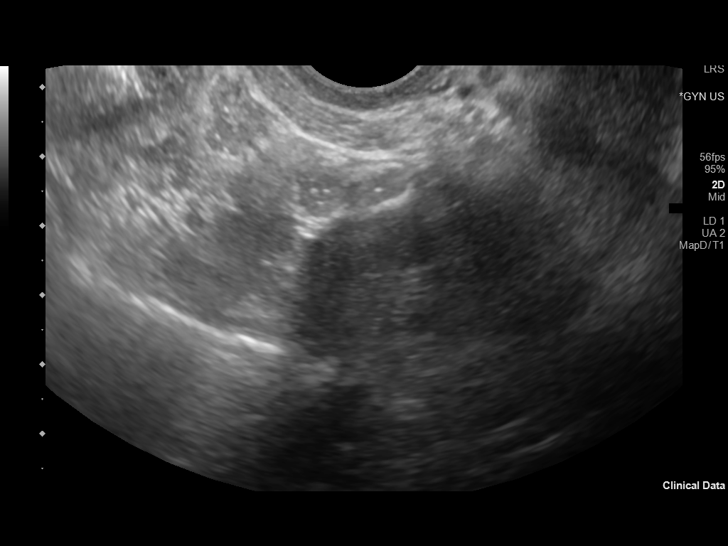
[im 27/36]
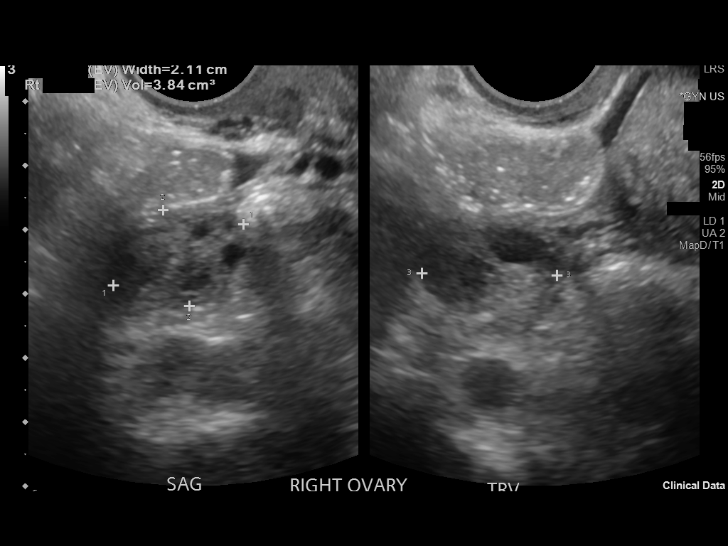
[im 30/36]
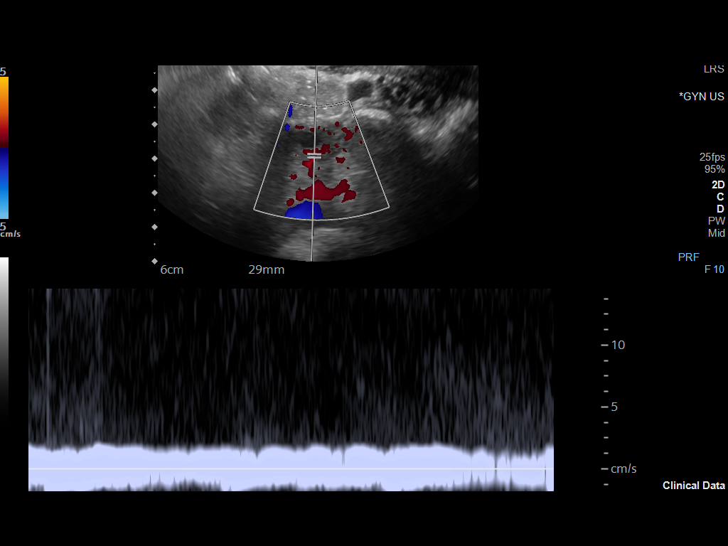
[im 33/36]
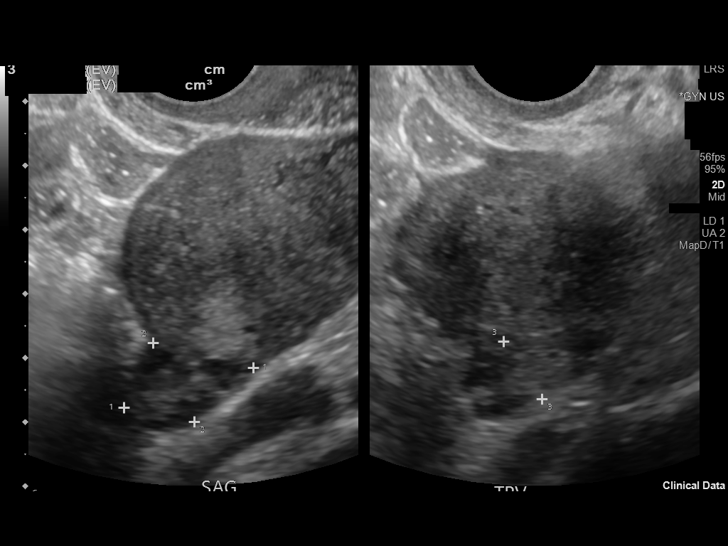
[im 36/36]
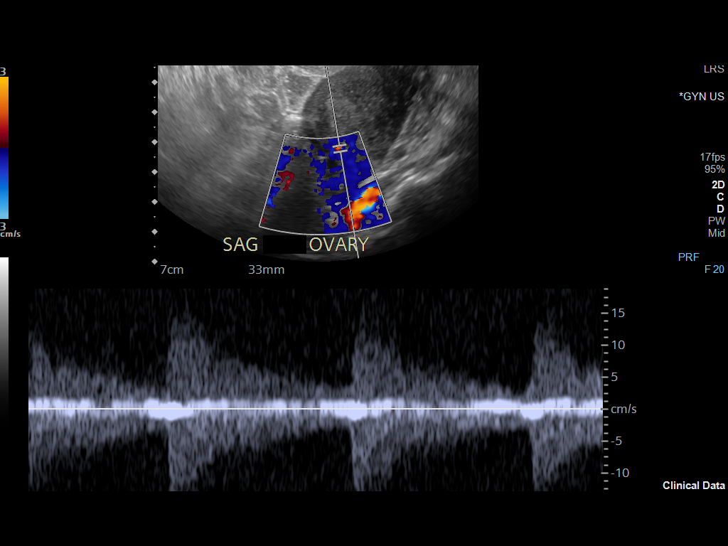

[13 of 25 positions shown; findings below may reference images not displayed]

FINDINGS: Uterus

Measurements: 7.0 x 4.1 x 4.7 cm = volume: 69.9 mL. No fibroids or
other mass visualized.

Endometrium

Thickness: 4 mm.  No focal abnormality visualized.

Right ovary

Measurements: 2.2 x 1.6 x 2.1 cm = volume: 3.8 mL. Normal
appearance/no adnexal mass.

Left ovary

Measurements: 2.1 x 1.4 x 1.1 cm = volume: 1.7 mL. Normal
appearance/no adnexal mass.

Pulsed Doppler evaluation of both ovaries demonstrates normal
low-resistance arterial and venous waveforms.

Other findings

No abnormal free fluid.
IMPRESSION: Normal pelvic ultrasound. No evidence for torsion or other acute
finding.

## 2020-11-16 IMAGING — CR CHEST - 2 VIEW
2 series · 2 of 2 positions shown · non-contrast
Comparison: None.

CLINICAL DATA: Overdose.

EXAM:
CHEST - 2 VIEW

[w chest pa]
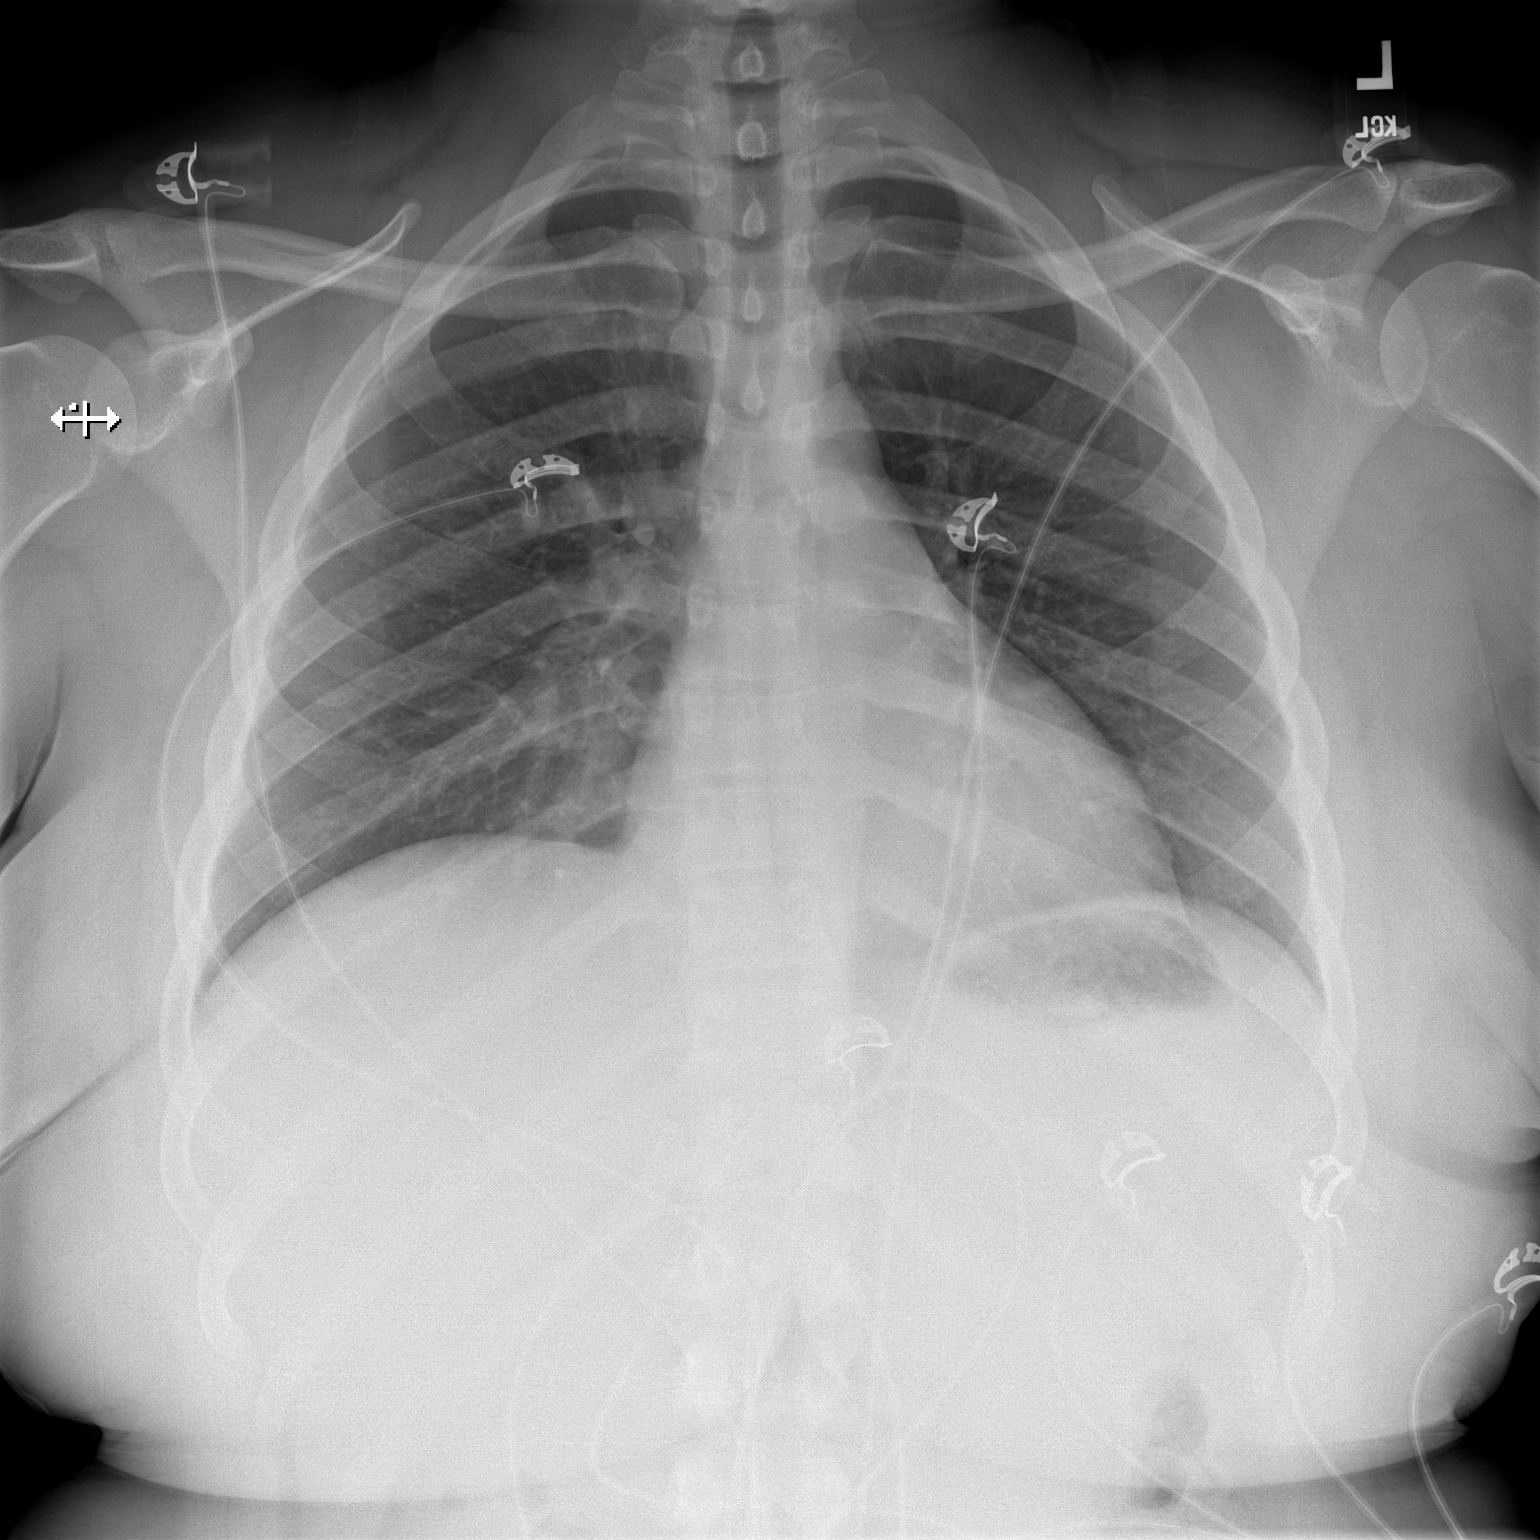

[w chest lat]
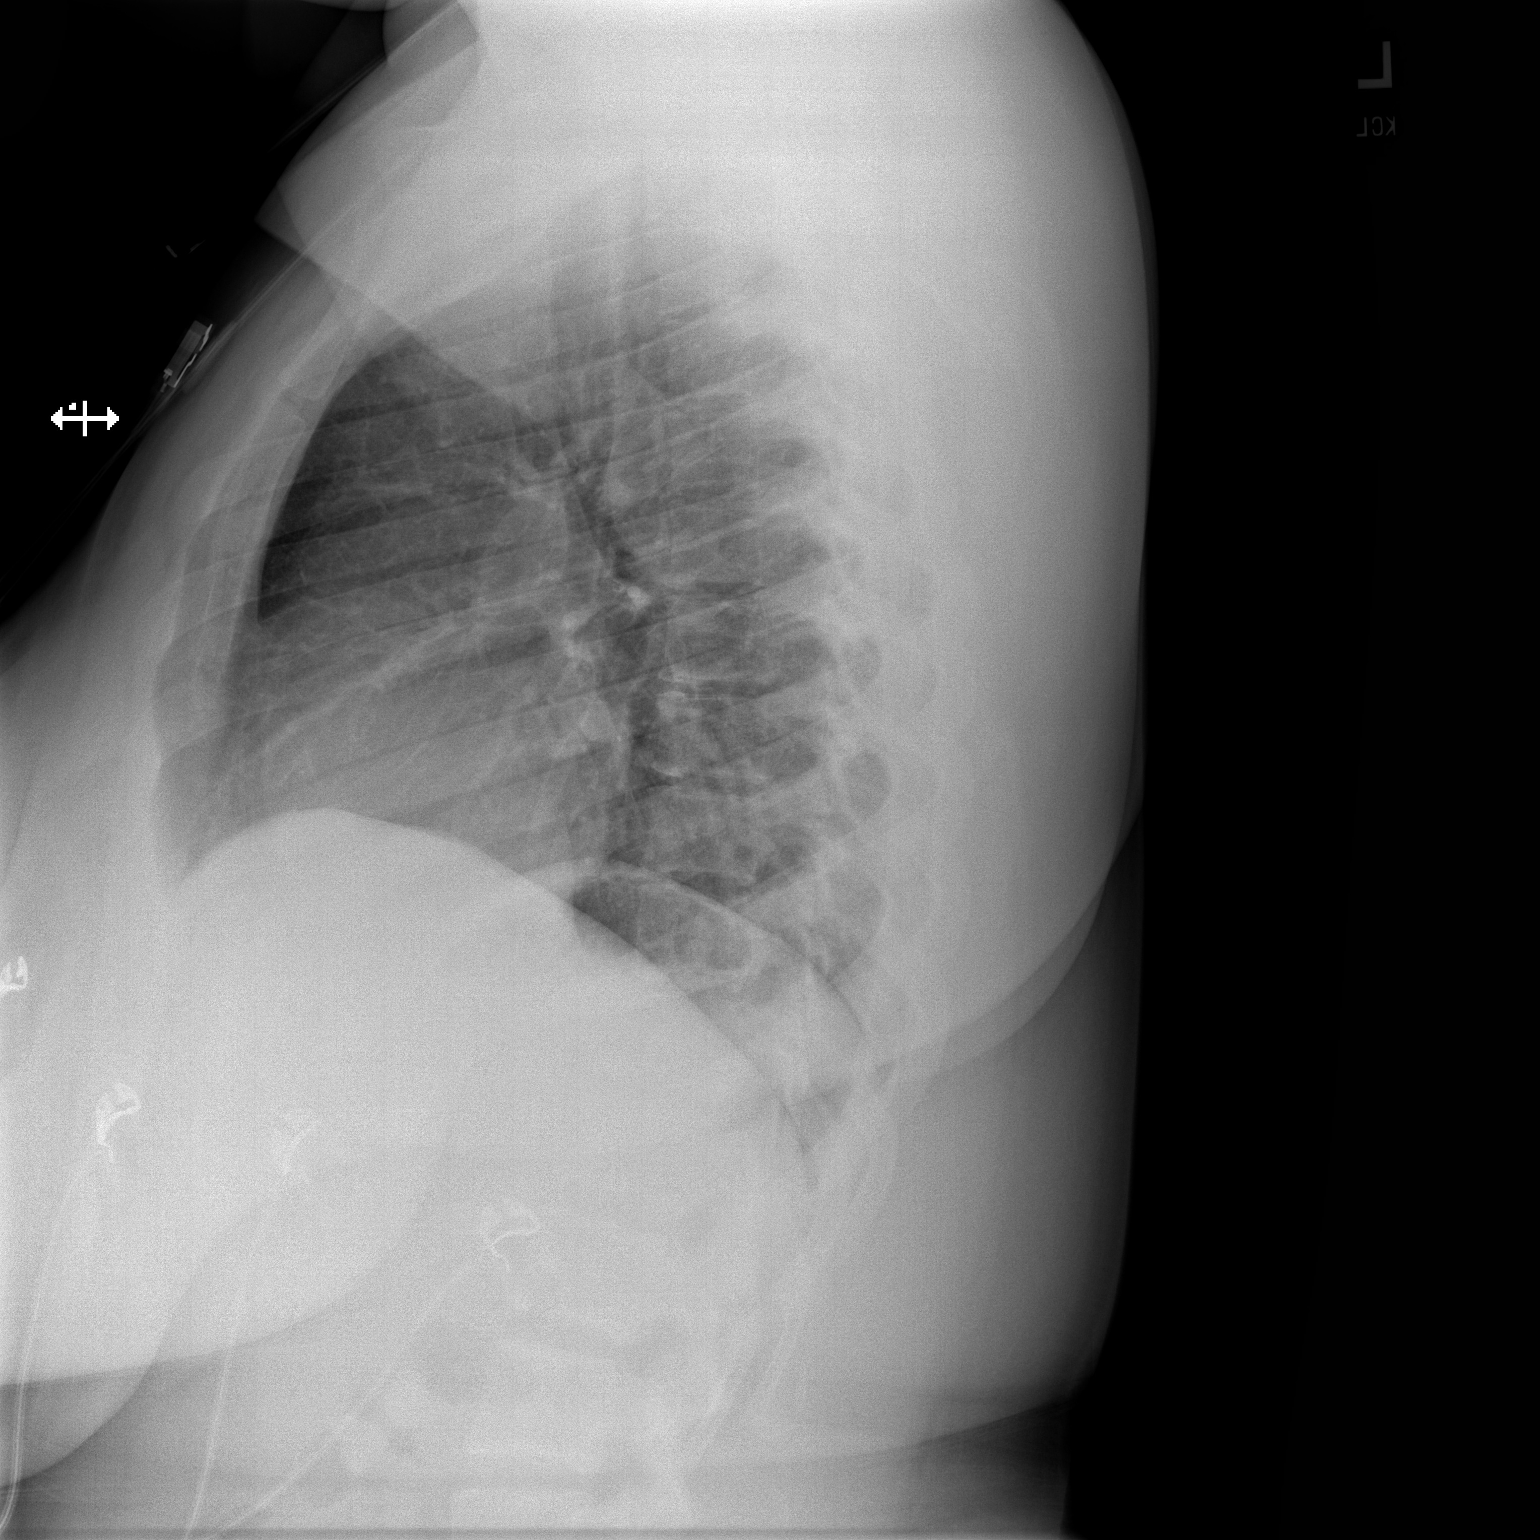

[2 of 2 positions shown; findings below may reference images not displayed]

FINDINGS: The heart size and mediastinal contours are within normal limits.
Both lungs are clear. The visualized skeletal structures are
unremarkable.
IMPRESSION: No active cardiopulmonary disease.
